# Patient Record
Sex: Female | Born: 1980 | Race: Black or African American | Hispanic: No | Marital: Married | Smoking: Never smoker
Health system: Southern US, Community
[De-identification: ages and names within clinical notes are randomized; demographics above are authoritative.]

## PROBLEM LIST (undated history)

## (undated) DIAGNOSIS — R519 Headache, unspecified: Secondary | ICD-10-CM

## (undated) DIAGNOSIS — F329 Major depressive disorder, single episode, unspecified: Secondary | ICD-10-CM

## (undated) DIAGNOSIS — M549 Dorsalgia, unspecified: Secondary | ICD-10-CM

## (undated) DIAGNOSIS — F32A Depression, unspecified: Secondary | ICD-10-CM

## (undated) DIAGNOSIS — R51 Headache: Secondary | ICD-10-CM

## (undated) HISTORY — DX: Major depressive disorder, single episode, unspecified: F32.9

## (undated) HISTORY — PX: OTHER SURGICAL HISTORY: SHX169

## (undated) HISTORY — DX: Depression, unspecified: F32.A

## (undated) HISTORY — DX: Headache, unspecified: R51.9

## (undated) HISTORY — DX: Headache: R51

## (undated) HISTORY — DX: Dorsalgia, unspecified: M54.9

---

## 2004-03-19 ENCOUNTER — Emergency Department (HOSPITAL_COMMUNITY): Admission: EM | Admit: 2004-03-19 | Discharge: 2004-03-19 | Payer: Self-pay | Admitting: Emergency Medicine

## 2004-12-21 ENCOUNTER — Emergency Department (HOSPITAL_COMMUNITY): Admission: EM | Admit: 2004-12-21 | Discharge: 2004-12-22 | Payer: Self-pay | Admitting: Emergency Medicine

## 2007-02-12 ENCOUNTER — Encounter (INDEPENDENT_AMBULATORY_CARE_PROVIDER_SITE_OTHER): Payer: Self-pay | Admitting: Obstetrics and Gynecology

## 2007-02-12 ENCOUNTER — Inpatient Hospital Stay (HOSPITAL_COMMUNITY): Admission: AD | Admit: 2007-02-12 | Discharge: 2007-02-14 | Payer: Self-pay | Admitting: Obstetrics and Gynecology

## 2010-04-01 ENCOUNTER — Inpatient Hospital Stay (HOSPITAL_COMMUNITY)
Admission: AD | Admit: 2010-04-01 | Discharge: 2010-04-03 | DRG: 775 | Disposition: A | Discharge status: Home / Self Care | Payer: 59 | Source: Ambulatory Visit | Attending: Obstetrics and Gynecology | Admitting: Obstetrics and Gynecology

## 2010-04-01 DIAGNOSIS — O409XX0 Polyhydramnios, unspecified trimester, not applicable or unspecified: Principal | ICD-10-CM | POA: Diagnosis present

## 2010-04-02 LAB — CBC
HCT: 31.3 % — ABNORMAL LOW (ref 36.0–46.0)
MCV: 89.2 fL (ref 78.0–100.0)
RBC: 3.51 MIL/uL — ABNORMAL LOW (ref 3.87–5.11)
WBC: 12.2 10*3/uL — ABNORMAL HIGH (ref 4.0–10.5)

## 2010-05-26 ENCOUNTER — Inpatient Hospital Stay (HOSPITAL_COMMUNITY): Admission: AD | Admit: 2010-05-26 | Payer: Self-pay | Source: Home / Self Care | Admitting: Obstetrics and Gynecology

## 2010-10-16 LAB — CBC
HCT: 28.9 — ABNORMAL LOW
Hemoglobin: 9.9 — ABNORMAL LOW
MCHC: 34.4
MCV: 91.3
RBC: 3.16 — ABNORMAL LOW

## 2012-09-04 ENCOUNTER — Other Ambulatory Visit: Payer: Self-pay | Admitting: Family

## 2012-09-04 ENCOUNTER — Ambulatory Visit
Admission: RE | Admit: 2012-09-04 | Discharge: 2012-09-04 | Disposition: A | Discharge status: Home / Self Care | Payer: 59 | Source: Ambulatory Visit | Attending: Family | Admitting: Family

## 2012-09-04 DIAGNOSIS — R200 Anesthesia of skin: Secondary | ICD-10-CM

## 2012-09-04 DIAGNOSIS — R531 Weakness: Secondary | ICD-10-CM

## 2012-09-29 ENCOUNTER — Ambulatory Visit (INDEPENDENT_AMBULATORY_CARE_PROVIDER_SITE_OTHER): Payer: 59 | Admitting: Neurology

## 2012-09-29 ENCOUNTER — Encounter: Payer: Self-pay | Admitting: Neurology

## 2012-09-29 VITALS — BP 130/80 | Ht 63.0 in | Wt 161.0 lb

## 2012-09-29 DIAGNOSIS — G43709 Chronic migraine without aura, not intractable, without status migrainosus: Secondary | ICD-10-CM | POA: Insufficient documentation

## 2012-09-29 DIAGNOSIS — G43909 Migraine, unspecified, not intractable, without status migrainosus: Secondary | ICD-10-CM

## 2012-09-29 MED ORDER — RIZATRIPTAN BENZOATE 10 MG PO TABS: 10.0000 mg | ORAL_TABLET | ORAL | Status: DC | PRN

## 2012-09-29 MED ORDER — PROPRANOLOL HCL 20 MG PO TABS: 20.0000 mg | ORAL_TABLET | Freq: Two times a day (BID) | ORAL | Status: DC

## 2012-09-29 MED ORDER — NORTRIPTYLINE HCL 10 MG PO CAPS: 20.0000 mg | ORAL_CAPSULE | Freq: Every day | ORAL | Status: DC

## 2012-09-29 NOTE — Progress Notes (Signed)
[facility]  PATIENT: Abigail Henry DOB: Aug 29, 1980  HISTORICAL  [name] is a 32 years old right-handed African American female, referred by her primary care physician Dr. Darcella Gasman for evaluation of chronic migraine headaches  Reported history of migraine headaches since 2012,  Her headache usually involves bilateral frontal temporal region, severe pounding headache with associated light noise sensitivity, dizziness, lasting all day, she has migraine 3 times a week, induced by stress, lack of sleep, postpartum period of time, she was put on contraceptives since January 2014, reported moderate improvement, but still has 3 headaches each week at least, she has tried Relpax, which helped her headache in 30 minutes, but she has to take a nap afterwards,  She also tried preventive medications Inderal, but she only taking 20 mg as needed basis, she states it helps, but never took it regularly like prescribed, she has tried Topamax titrating up to 200 mg a day without significant benefit,  She denied visual change,  REVIEW OF SYSTEMS: Full 14 system review of systems performed and notable only for joint pain, achy muscles, headache numbness dizziness  ALLERGIES: No Known Allergies  HOME MEDICATIONS: No outpatient prescriptions prior to visit.   No facility-administered medications prior to visit.    PAST MEDICAL HISTORY: Past Medical History  Diagnosis Date  . HA (headache)     PAST SURGICAL HISTORY: Past Surgical History  Procedure Laterality Date  . None      FAMILY HISTORY: Family History  Problem Relation Age of Onset  . High blood pressure Mother     SOCIAL HISTORY:  History   Social History  . Marital Status: Single    Spouse Name: [name]    Number of Children: 2  . Years of Education: 12   Occupational History  .  [employer]   Social History Main Topics  . Smoking status: Never Smoker   . Smokeless tobacco: Never Used  . Alcohol  Use: 0.6 oz/week    1 Cans of beer per week     Comment: OCC  . Drug Use: No  . Sexual Activity: Not on file   Other Topics Concern  . Not on file   Social History Narrative   Patient lives at home with her husband [name]). Patient works full time. [employer] .     Caffeine- two cups    Right handed.     PHYSICAL EXAM    Filed Vitals:   09/29/12 1446  Height: 5\' 3"  (1.6 m)  Weight: 161 lb (73.029 kg)    Not recorded    Body mass index is 28.53 kg/(m^2).   Generalized: In no acute distress  Neck: Supple, no carotid bruits   Cardiac: Regular rate rhythm  Pulmonary: Clear to auscultation bilaterally  Musculoskeletal: No deformity  Neurological examination  Mentation: Alert oriented to time, place, history taking, and causual conversation  Cranial nerve II-XII: Pupils were equal round reactive to light extraocular movements were full, visual field were full on confrontational test. facial sensation and strength were normal. hearing was intact to finger rubbing bilaterally. Uvula tongue midline.  head turning and shoulder shrug and were normal and symmetric.Tongue protrusion into cheek strength was normal.  Motor: normal tone, bulk and strength.  Sensory: Intact to fine touch, pinprick, preserved vibratory sensation, and proprioception at toes.  Coordination: Normal finger to nose, heel-to-shin bilaterally there was no truncal ataxia  Gait: Rising up from seated position without assistance, normal stance, without trunk ataxia, moderate  stride, good arm swing, smooth turning, able to perform tiptoe, and heel walking without difficulty.   Romberg signs: Negative  Deep tendon reflexes: Brachioradialis 2/2, biceps 2/2, triceps 2/2, patellar 2/2, Achilles 2/2, plantar responses were flexor bilaterally.   DIAGNOSTIC DATA (LABS, IMAGING, TESTING) - I reviewed patient records, labs, notes, testing and imaging myself where available.  Lab Results  Component Value Date    WBC 12.2* 04/02/2010   HGB 10.3* 04/02/2010   HCT 31.3* 04/02/2010   MCV 89.2 04/02/2010   PLT 282 04/02/2010    ASSESSMENT AND PLAN   32 years old right-handed African American female with chronic migraine headaches, normal neurological examination, she desires further evaluations,  1. proceed with MRI of the brain without contrast 2.  keep preventive medication Inderal 20 mg twice a day,, tapering off topiramate, which was not helping her, add on nortriptyline 10 mg titrating to 20 mg every night.  3. Maxalt as needed for abortive treatment.              Levert Feinstein, M.D. Ph.D.  [facility] [address], [address] [place], [place] [address] [phone]

## 2012-10-10 ENCOUNTER — Emergency Department (HOSPITAL_COMMUNITY)
Admission: EM | Admit: 2012-10-10 | Discharge: 2012-10-10 | Disposition: A | Discharge status: Home / Self Care | Payer: 59 | Attending: Emergency Medicine | Admitting: Emergency Medicine

## 2012-10-10 ENCOUNTER — Encounter (HOSPITAL_COMMUNITY): Payer: Self-pay | Admitting: Emergency Medicine

## 2012-10-10 DIAGNOSIS — G43909 Migraine, unspecified, not intractable, without status migrainosus: Secondary | ICD-10-CM | POA: Insufficient documentation

## 2012-10-10 DIAGNOSIS — R519 Headache, unspecified: Secondary | ICD-10-CM

## 2012-10-10 DIAGNOSIS — R42 Dizziness and giddiness: Secondary | ICD-10-CM | POA: Insufficient documentation

## 2012-10-10 DIAGNOSIS — Z79899 Other long term (current) drug therapy: Secondary | ICD-10-CM | POA: Insufficient documentation

## 2012-10-10 MED ORDER — KETOROLAC TROMETHAMINE 15 MG/ML IJ SOLN
15.0000 mg | Freq: Once | INTRAMUSCULAR | Status: AC
  Administered 2012-10-10: 15 mg via INTRAVENOUS
  Filled 2012-10-10: qty 1

## 2012-10-10 MED ORDER — SODIUM CHLORIDE 0.9 % IV BOLUS (SEPSIS)
500.0000 mL | Freq: Once | INTRAVENOUS | Status: AC
  Administered 2012-10-10: 500 mL via INTRAVENOUS

## 2012-10-10 MED ORDER — METOCLOPRAMIDE HCL 5 MG/ML IJ SOLN
10.0000 mg | Freq: Once | INTRAMUSCULAR | Status: AC
  Administered 2012-10-10: 10 mg via INTRAVENOUS
  Filled 2012-10-10: qty 2

## 2012-10-10 MED ORDER — DIPHENHYDRAMINE HCL 50 MG/ML IJ SOLN
25.0000 mg | Freq: Once | INTRAMUSCULAR | Status: AC
  Administered 2012-10-10: 25 mg via INTRAVENOUS
  Filled 2012-10-10: qty 1

## 2012-10-10 NOTE — ED Provider Notes (Signed)
CSN: [id number]     Arrival date & time 10/10/12  1478 History   First MD Initiated Contact with Patient 10/10/12 0820     Chief Complaint  Patient presents with  . Migraine   (Consider location/radiation/quality/duration/timing/severity/associated sxs/prior Treatment) HPI Comments: Patient with history of migraine headaches presents with acute onset of headache last evening that is similar to her usual headaches. Headache is frontal with radiation to the back of her head. She has positive photophobia, phonophobia, nausea but no vomiting. Unlike her typical headaches, she states that she feels somewhat dizzy but does not have any trouble walking. Dizziness is described as spinning. She denies recent head injury, fever, neck pain. No weakness in her arms or legs. She's been taking her chronic medications as prescribed. She recently began taking a new prophylactic medication. Course is constant. Nothing makes symptoms better.  Patient is a 32 y.o. female presenting with migraines. The history is provided by the patient.  Migraine Associated symptoms include headaches. Pertinent negatives include no chest pain, congestion, fever, nausea, neck pain, numbness, rash, vomiting or weakness.    Past Medical History  Diagnosis Date  . HA (headache)    Past Surgical History  Procedure Laterality Date  . None     Family History  Problem Relation Age of Onset  . High blood pressure Mother    History  Substance Use Topics  . Smoking status: Never Smoker   . Smokeless tobacco: Never Used  . Alcohol Use: 0.6 oz/week    1 Cans of beer per week     Comment: OCC   OB History   Grav Para Term Preterm Abortions TAB SAB Ect Mult Living                 Review of Systems  Constitutional: Negative for fever.  HENT: Negative for congestion, rhinorrhea, neck pain, neck stiffness, dental problem and sinus pressure.   Eyes: Positive for photophobia. Negative for discharge, redness and visual  disturbance.  Respiratory: Negative for shortness of breath.   Cardiovascular: Negative for chest pain.  Gastrointestinal: Negative for nausea and vomiting.  Musculoskeletal: Negative for gait problem.  Skin: Negative for rash.  Neurological: Positive for dizziness and headaches. Negative for syncope, speech difficulty, weakness, light-headedness and numbness.  Psychiatric/Behavioral: Negative for confusion.    Allergies  Review of patient's allergies indicates no known allergies.  Home Medications   Current Outpatient Rx  Name  Route  Sig  Dispense  Refill  . levonorgestrel-ethinyl estradiol (SEASONALE,INTROVALE,JOLESSA) 0.15-0.03 MG tablet   Oral   Take 1 tablet by mouth daily.          . nortriptyline (PAMELOR) 10 MG capsule   Oral   Take 20 mg by mouth at bedtime.         . promethazine (PHENERGAN) 12.5 MG tablet   Oral   Take 12.5 mg by mouth daily.         . propranolol (INDERAL) 20 MG tablet   Oral   Take 1 tablet (20 mg total) by mouth 2 (two) times daily.   60 tablet   12   . RELPAX 20 MG tablet   Oral   Take 20 mg by mouth daily as needed for migraine.          . rizatriptan (MAXALT) 10 MG tablet   Oral   Take 1 tablet (10 mg total) by mouth as needed for migraine. May repeat in 2 hours if needed   15 tablet  12   . topiramate (TOPAMAX) 200 MG tablet   Oral   Take 200 mg by mouth daily.           BP 121/75  Pulse 84  Temp(Src) 98.3 F (36.8 C) (Oral)  Resp 18  SpO2 100% Physical Exam  Nursing note and vitals reviewed. Constitutional: She is oriented to person, place, and time. She appears well-developed and well-nourished.  HENT:  Head: Normocephalic and atraumatic.  Right Ear: Tympanic membrane, external ear and ear canal normal.  Left Ear: Tympanic membrane, external ear and ear canal normal.  Nose: Nose normal.  Mouth/Throat: Uvula is midline, oropharynx is clear and moist and mucous membranes are normal.  Eyes: Conjunctivae,  EOM and lids are normal. Pupils are equal, round, and reactive to light. Right eye exhibits no nystagmus. Left eye exhibits no nystagmus.  Neck: Normal range of motion. Neck supple.  Cardiovascular: Normal rate and regular rhythm.   Pulmonary/Chest: Effort normal and breath sounds normal.  Abdominal: Soft. There is no tenderness.  Musculoskeletal:       Cervical back: She exhibits normal range of motion, no tenderness and no bony tenderness.  Neurological: She is alert and oriented to person, place, and time. She has normal strength and normal reflexes. No cranial nerve deficit or sensory deficit. She exhibits normal muscle tone. She displays a negative Romberg sign. Coordination and gait normal. GCS eye subscore is 4. GCS verbal subscore is 5. GCS motor subscore is 6.  Skin: Skin is warm and dry.  Psychiatric: She has a normal mood and affect.    ED Course  Procedures (including critical care time) Labs Review Labs Reviewed - No data to display Imaging Review No results found.  9:07 AM Patient seen and examined. Medications ordered. Normal neurological exam. No features of history or exam which would indicate need for imaging.   Vital signs reviewed and are as follows: Filed Vitals:   10/10/12 0823  BP: 121/75  Pulse: 84  Temp: 98.3 F (36.8 C)  Resp: 18   10:32 AM Pt states much improvement and she has milder residual HA. Will give toradol and discharge. Neurological exam is unchanged. She is to f/u with PCP/neurologist. Counseled patient to rest for the remainder of the day and hydrate well.   Patient urged to return with worsening symptoms or other concerns. Patient verbalized understanding and agrees with plan.    MDM   1. Headache    Pt with HA very similar to previous. Known h/o migraine followed by neurology. No head trauma. No thunderclap or other history suspicious for SAH/sentinel bleeding. No meningismus or fever suggestive of meningitis. Pt appears well.. Neuro  exam is stable/unchanged during ED stay.    Renne Crigler, PA-C 10/10/12 1038

## 2012-10-10 NOTE — ED Notes (Signed)
Pt states that she has been having a migraine since yesterday.  C/o pain on the front side of her head.  States that she is nauseated and dizzy.  Sensitivity to light and sound.

## 2012-10-10 NOTE — ED Provider Notes (Signed)
  Medical screening examination/treatment/procedure(s) were performed by non-physician practitioner and as supervising physician I was immediately available for consultation/collaboration.    Greenleigh Kauth, MD 10/10/12 1551 

## 2012-10-18 ENCOUNTER — Ambulatory Visit (INDEPENDENT_AMBULATORY_CARE_PROVIDER_SITE_OTHER): Payer: 59

## 2012-10-18 DIAGNOSIS — R51 Headache: Secondary | ICD-10-CM

## 2012-10-18 DIAGNOSIS — G43909 Migraine, unspecified, not intractable, without status migrainosus: Secondary | ICD-10-CM

## 2012-10-20 ENCOUNTER — Telehealth: Payer: Self-pay | Admitting: Neurology

## 2012-10-20 NOTE — Progress Notes (Signed)
Quick Note:  Call pt about her results of the MRI, pt verbalized understanding. ______

## 2012-10-20 NOTE — Telephone Encounter (Signed)
Call pt about results, pt verbalized understanding.

## 2012-10-20 NOTE — Telephone Encounter (Signed)
Message copied by Demetrios Loll on Fri Oct 20, 2012 11:27 AM ------      Message from: Levert Feinstein      Created: Thu Oct 19, 2012  2:51 PM       Please call pt for normal MRI brain. ------

## 2012-10-20 NOTE — Telephone Encounter (Signed)
Call pt no answer, left message. °

## 2012-11-30 ENCOUNTER — Other Ambulatory Visit: Payer: Self-pay

## 2013-01-01 ENCOUNTER — Ambulatory Visit: Payer: 59 | Admitting: Nurse Practitioner

## 2014-10-10 ENCOUNTER — Ambulatory Visit (INDEPENDENT_AMBULATORY_CARE_PROVIDER_SITE_OTHER): Payer: 59 | Admitting: Neurology

## 2014-10-10 ENCOUNTER — Encounter: Payer: Self-pay | Admitting: Neurology

## 2014-10-10 ENCOUNTER — Other Ambulatory Visit: Payer: Self-pay | Admitting: Neurology

## 2014-10-10 VITALS — BP 122/79 | HR 101 | Ht 63.0 in | Wt 167.0 lb

## 2014-10-10 DIAGNOSIS — M545 Low back pain: Secondary | ICD-10-CM

## 2014-10-10 DIAGNOSIS — G43009 Migraine without aura, not intractable, without status migrainosus: Secondary | ICD-10-CM

## 2014-10-10 DIAGNOSIS — M79604 Pain in right leg: Secondary | ICD-10-CM

## 2014-10-10 MED ORDER — TIZANIDINE HCL 4 MG PO TABS: 4.0000 mg | ORAL_TABLET | Freq: Four times a day (QID) | ORAL | Status: DC | PRN

## 2014-10-10 MED ORDER — NORTRIPTYLINE HCL 25 MG PO CAPS: 50.0000 mg | ORAL_CAPSULE | Freq: Every day | ORAL | Status: DC

## 2014-10-10 MED ORDER — ZOLMITRIPTAN 5 MG PO TBDP: 5.0000 mg | ORAL_TABLET | ORAL | Status: DC | PRN

## 2014-10-10 NOTE — Progress Notes (Signed)
PATIENT: Abigail Henry DOB: December 23, 1980  Chief Complaint  Patient presents with  . Back Pain    She has been having mid and low back pain.  She has some radiation to her right hip, buttock and leg down to her knee.  She has also experienced right arm numbness and tingling.  She recently completed a Prednisone dose pack without much benefit.  . Migraine    Her migraines are much worse.  She estimates 15+ headache days per month.  She is no longer taking a prophylactic medication.  Relpax is sometimes helpful.     HISTORICAL  Abigail Henry is a 34 years old right-handed female, seen in refer in October 10 2014 by  her primary care physician Dr. Marcellus Scott for evaluation of low back pain, migraine headaches  I have reviewed laboratory evaluation in March 2015, normal TSH, vitamin D level was low 9, normal being 30 and above, normal free T4, LDL was 33, cholesterol was 148, normal CBC, CMP, creatinine was 0.9  She had a history of migraine since 2012, usually starting from back of her head, spreading forward to become a pounding headaches, retro-orbital area, with associated light noise sensitivity, lasting for a few hours, she has up to 15 or more headaches days in a month  I have seen her in 2014 for similar complaints, she has tried Imitrex, cause sleepiness, not help her headaches, Maxalt, not effective, she has been taking Relpax as needed, only helped 20% of time, sometimes she take marijuana as needed basis for headaches,  Trigger for her headaches are weather change, stress, exertion, sleep deprivation, She has been taking Inderal 20 mg twice a day which has helped her headaches, previously tried nortriptyline was helpful, but she run out of refills, Topamax was no help, allergy pills Zyrtec was not helpful   Since 2012, she also noticed intermittent low back pain, getting worse since January 2016, right side low back pain, radiating to her right hip, right lateral leg, getting  worse with prolonged sitting, she denied persistent lower extremity sensory loss or weakness, she denies gait difficulty, she has no bowel bladder incontinence.  Since September 2016 she also has intermittent right hand paresthesia, radiating to right forearm, no weakness, no right facial involvement.  REVIEW OF SYSTEMS: Full 14 system review of systems performed and notable only for feeling hot, allergies, headaches, numbness, dizziness  ALLERGIES: No Known Allergies  HOME MEDICATIONS: Current Outpatient Prescriptions  Medication Sig Dispense Refill  . levonorgestrel-ethinyl estradiol (SEASONALE,INTROVALE,JOLESSA) 0.15-0.03 MG tablet Take 1 tablet by mouth daily.     . methocarbamol (ROBAXIN) 500 MG tablet TAKE 1 TABLET BY MOUTH TWICE A DAY AS NEEDED FOR MUSCLE STRAIN/SPASM  2  . promethazine (PHENERGAN) 12.5 MG tablet Take 12.5 mg by mouth daily.    . propranolol (INDERAL) 20 MG tablet Take 1 tablet (20 mg total) by mouth 2 (two) times daily. 60 tablet 12  . rizatriptan (MAXALT) 10 MG tablet Take 1 tablet (10 mg total) by mouth as needed for migraine. May repeat in 2 hours if needed 15 tablet 12   No current facility-administered medications for this visit.    PAST MEDICAL HISTORY: Past Medical History  Diagnosis Date  . HA (headache)   . Back pain     PAST SURGICAL HISTORY: Past Surgical History  Procedure Laterality Date  . None      FAMILY HISTORY: Family History  Problem Relation Age of Onset  . High blood pressure Mother   .  Healthy Father     SOCIAL HISTORY:  Social History   Social History  . Marital Status: Single    Spouse Name: [name]  . Number of Children: 2  . Years of Education: 12   Occupational History  .  [employer]   Social History Main Topics  . Smoking status: Never Smoker   . Smokeless tobacco: Never Used  . Alcohol Use: 0.6 oz/week    1 Cans of beer per week     Comment: 2 - 3 glasses on the weekend  . Drug Use: Yes     Comment:  Marijuana occasionally for migraines  . Sexual Activity: Not on file   Other Topics Concern  . Not on file   Social History Narrative   Patient lives at home with her husband [name]) and two children. Patient works full time. [employer] .     Caffeine- two cups    Right handed.     PHYSICAL EXAM   Filed Vitals:   10/10/14 1024  BP: 122/79  Pulse: 101  Height: 5\' 3"  (1.6 m)  Weight: 167 lb (75.751 kg)    Not recorded      Body mass index is 29.59 kg/(m^2).  PHYSICAL EXAMNIATION:  Gen: NAD, conversant, well nourised, obese, well groomed                     Cardiovascular: Regular rate rhythm, no peripheral edema, warm, nontender. Eyes: Conjunctivae clear without exudates or hemorrhage Neck: Supple, no carotid bruise. Pulmonary: Clear to auscultation bilaterally   NEUROLOGICAL EXAM:  MENTAL STATUS: Speech:    Speech is normal; fluent and spontaneous with normal comprehension.  Cognition:     Orientation to time, place and person     Normal recent and remote memory     Normal Attention span and concentration     Normal Language, naming, repeating,spontaneous speech     Fund of knowledge   CRANIAL NERVES: CN II: Visual fields are full to confrontation. Fundoscopic exam is normal with sharp discs and no vascular changes. Pupils are round equal and briskly reactive to light. CN III, IV, VI: extraocular movement are normal. No ptosis. CN V: Facial sensation is intact to pinprick in all 3 divisions bilaterally. Corneal responses are intact.  CN VII: Face is symmetric with normal eye closure and smile. CN VIII: Hearing is normal to rubbing fingers CN IX, X: Palate elevates symmetrically. Phonation is normal. CN XI: Head turning and shoulder shrug are intact CN XII: Tongue is midline with normal movements and no atrophy.  MOTOR: There is no pronator drift of out-stretched arms. Muscle bulk and tone are normal. Muscle strength is normal.  REFLEXES: Reflexes are 2+ and  symmetric at the biceps, triceps, knees, and ankles. Plantar responses are flexor.  SENSORY: Intact to light touch, pinprick, position sense, and vibration sense are intact in fingers and toes.  COORDINATION: Rapid alternating movements and fine finger movements are intact. There is no dysmetria on finger-to-nose and heel-knee-shin.    GAIT/STANCE: Posture is normal. Gait is steady with normal steps, base, arm swing, and turning. Heel and toe walking are normal. Tandem gait is normal.  Romberg is absent.   DIAGNOSTIC DATA (LABS, IMAGING, TESTING) - I reviewed patient records, labs, notes, testing and imaging myself where available.   ASSESSMENT AND PLAN  Adaleigh Warf is a 34 y.o. female   Chronic migraines without aura  Continue preventive medications Inderal 20 mg twice a day  Add on nortriptyline titrating to 25 mg 2 tablets every night as migraine prevention  She has tried and failed Imitrex, Maxalt, suboptimal response to Relpax   We will try Zomig as needed, may add on Phenergan, tizanidine as needed  Chronic right low back pain, radiating pain to right lower extremity  Suggestive of right lumbar radiculopathy  EMG nerve conduction study  Back stretching exercise Right hand paresthesia  Possible right carpal tunnel syndrome  EMG nerve conduction study  Levert Feinstein, M.D. Ph.D.  [facility] [address], [address] [place], [place] [address] Ph: [phone] Fax: [phone]  CC: Dr. Marcellus Scott

## 2014-10-31 ENCOUNTER — Ambulatory Visit (INDEPENDENT_AMBULATORY_CARE_PROVIDER_SITE_OTHER): Payer: Self-pay | Admitting: Neurology

## 2014-10-31 ENCOUNTER — Ambulatory Visit (INDEPENDENT_AMBULATORY_CARE_PROVIDER_SITE_OTHER): Payer: 59 | Admitting: Neurology

## 2014-10-31 DIAGNOSIS — M79604 Pain in right leg: Secondary | ICD-10-CM

## 2014-10-31 DIAGNOSIS — R202 Paresthesia of skin: Secondary | ICD-10-CM | POA: Diagnosis not present

## 2014-10-31 DIAGNOSIS — Z0289 Encounter for other administrative examinations: Secondary | ICD-10-CM

## 2014-10-31 DIAGNOSIS — M545 Low back pain: Secondary | ICD-10-CM | POA: Diagnosis not present

## 2014-10-31 DIAGNOSIS — G43009 Migraine without aura, not intractable, without status migrainosus: Secondary | ICD-10-CM

## 2014-10-31 NOTE — Progress Notes (Signed)
Electrodiagnostic study is normal, there is no electrodiagnostic evidence of large fiber peripheral neuropathy, right cervical radiculopathy, or right lumbosacral radiculopathy.

## 2014-10-31 NOTE — Procedures (Signed)
   NCS (NERVE CONDUCTION STUDY) WITH EMG (ELECTROMYOGRAPHY) REPORT   STUDY DATE: October 31 2014 PATIENT NAME: Abigail Henry DOB: Nov 28, 1980 MRN: [id number]    TECHNOLOGIST: Gearldine Shown ELECTROMYOGRAPHER: Levert Feinstein M.D.  CLINICAL INFORMATION:  34 year old right-handed female, complains of intermittent right leg discomfort pain, right hand paresthesia.  FINDINGS: NERVE CONDUCTION STUDY: Bilateral peroneal sensory responses were normal. Bilateral peroneal to EDB, tibial motor responses were normal. Bilateral tibial H reflexes were normal and symmetric.  Right median, ulnar sensory and motor responses were normal.  NEEDLE ELECTROMYOGRAPHY: Selective needle examinations were performed at right upper and lower lower extremity muscles, right cervical and lumbosacral paraspinal muscles.  Needle examination of right tibialis anterior, tibialis posterior, medial gastrocnemius, vastus lateralis, biceps femoris short head was normal.  Selected needle examination of right extensor digitorum communis, pronator teres, biceps, triceps, deltoid was normal.  There was no spontaneous activity at right cervical paraspinal muscles, right C5, C6, 7; right L4, 5, S1.  IMPRESSION:  This is a normal study. There is NO electrodiagnostic evidence of large fiber peripheral neuropathy, right cervical radiculopathy, or right lumbosacral radiculopathy.    INTERPRETING PHYSICIAN:   Levert Feinstein M.D. Ph.D. [facility] [address] [place], [place] [address] [phone]

## 2014-11-14 ENCOUNTER — Telehealth: Payer: Self-pay | Admitting: Neurology

## 2014-11-14 NOTE — Telephone Encounter (Signed)
Left patient a message letting her know that she can drop off her FMLA paperwork to the front desk.  I told her we try to get to the paperwork completed as soon as possible.  Our office tells patients to allow 10 business days but I told her to let us know if she has a specific deadline.

## 2014-11-14 NOTE — Telephone Encounter (Signed)
Patient is calling. She needs to drop off FMLA paperwork and is wondering if she drops it off can it be filled out. Please call and discuss. Thank you.

## 2014-11-25 ENCOUNTER — Telehealth: Payer: Self-pay | Admitting: *Deleted

## 2014-11-25 NOTE — Telephone Encounter (Signed)
Patient form on Michelle desk. 

## 2014-11-27 NOTE — Telephone Encounter (Addendum)
Spoke to patient. Explained why unable to honor FMLA paperwork. Patient verbalized understanding.

## 2014-11-27 NOTE — Telephone Encounter (Signed)
Called patient to inform her [employer] will not be able to honor her FMLA paperwork. No answer. Left vmail to return call.

## 2014-12-02 ENCOUNTER — Encounter: Payer: Self-pay | Admitting: Neurology

## 2014-12-02 ENCOUNTER — Ambulatory Visit (INDEPENDENT_AMBULATORY_CARE_PROVIDER_SITE_OTHER): Payer: 59 | Admitting: Neurology

## 2014-12-02 VITALS — BP 118/81 | HR 87 | Ht 63.0 in | Wt 166.0 lb

## 2014-12-02 DIAGNOSIS — M545 Low back pain, unspecified: Secondary | ICD-10-CM

## 2014-12-02 DIAGNOSIS — G43009 Migraine without aura, not intractable, without status migrainosus: Secondary | ICD-10-CM | POA: Diagnosis not present

## 2014-12-02 MED ORDER — PROPRANOLOL HCL ER 80 MG PO CP24: 80.0000 mg | ORAL_CAPSULE | Freq: Every day | ORAL | Status: DC

## 2014-12-02 MED ORDER — NORTRIPTYLINE HCL 25 MG PO CAPS: 75.0000 mg | ORAL_CAPSULE | Freq: Every day | ORAL | Status: DC

## 2014-12-02 NOTE — Progress Notes (Signed)
Chief Complaint  Patient presents with  . Migraine    She has titrated her nortriptyline dose to  at bedtime and is taking Inderal  BID.  She is still estimating 3-5 headache days per week.  She has gotten some relief with Zomig for her more severe pain.  . Back Pain    She would like to discuss her EMG/NCV results.  . Hand Paresthesia    She would like to discuss her EMG/NCV results.      PATIENT: Abigail Henry DOB: October 04, 1980  Chief Complaint  Patient presents with  . Migraine    She has titrated her nortriptyline dose to  at bedtime and is taking Inderal  BID.  She is still estimating 3-5 headache days per week.  She has gotten some relief with Zomig for her more severe pain.  . Back Pain    She would like to discuss her EMG/NCV results.  . Hand Paresthesia    She would like to discuss her EMG/NCV results.     HISTORICAL  Abigail Henry is a 34 years old right-handed female, seen in refer in October 10 2014 by  her primary care physician Dr. Marcellus Scott for evaluation of low back pain, migraine headaches  I have reviewed laboratory evaluation in March 2015, normal TSH, vitamin D level was low 9, normal being 30 and above, normal free T4, LDL was 33, cholesterol was 148, normal CBC, CMP, creatinine was 0.9  She had a history of migraine since 2012, usually starting from back of her head, spreading forward to become a pounding headaches, retro-orbital area, with associated light noise sensitivity, lasting for a few hours, she has up to 15 or more headaches days in a month  I have seen her in 2014 for similar complaints, she has tried Imitrex, cause sleepiness, not help her headaches, Maxalt, not effective, she has been taking Relpax as needed, only helped 20% of time, sometimes she take marijuana as needed basis for headaches,  Trigger for her headaches are weather change, stress, exertion, sleep deprivation, She has been taking Inderal 20 mg twice a day which  has helped her headaches, previously tried nortriptyline was helpful, but she run out of refills, Topamax was no help, allergy pills Zyrtec was not helpful   Since 2012, she also noticed intermittent low back pain, getting worse since January 2016, right side low back pain, radiating to her right hip, right lateral leg, getting worse with prolonged sitting, she denied persistent lower extremity sensory loss or weakness, she denies gait difficulty, she has no bowel bladder incontinence.  Since September 2016 she also has intermittent right hand paresthesia, radiating to right forearm, no weakness, no right facial involvement.  UPDATE Dec 02 2014: EMG nerve conduction study was normal in October 31 2014, she complains of upper back pain, denies radiating pain, no gait difficulty, EMG nerve conduction study October 31 2014 was normal. There was no evidence of peripheral neuropathy, cervical radiculopathy, or right lumbosacral radiculopathy. She still has headaches 2-5 times each week, moderate severe, with associated light noise sensitivity, she think Inderal 20 mg twice a day, nortriptyline 25 mg 2 tablets every night was helpful as preventive medications, Zomig when necessary was effective as abortive treatment.  REVIEW OF SYSTEMS: Full 14 system review of systems performed and notable only for dizziness headaches, light sensitivity ALLERGIES: No Known Allergies  HOME MEDICATIONS: Current Outpatient Prescriptions  Medication Sig Dispense Refill  . CAMRESE 0.15-0.03 &0.01 MG tablet Take 1 tablet by  mouth daily.  2  . methocarbamol (ROBAXIN) 500 MG tablet TAKE 1 TABLET BY MOUTH TWICE A DAY AS NEEDED FOR MUSCLE STRAIN/SPASM  2  . nortriptyline (PAMELOR) 25 MG capsule Take 2 capsules (50 mg total) by mouth at bedtime. 60 capsule 6  . promethazine (PHENERGAN) 12.5 MG tablet Take 12.5 mg by mouth daily.    . propranolol (INDERAL) 20 MG tablet Take 1 tablet (20 mg total) by mouth 2 (two) times daily. 60  tablet 12  . tiZANidine (ZANAFLEX) 4 MG tablet Take 1 tablet (4 mg total) by mouth every 6 (six) hours as needed for muscle spasms. 30 tablet 6  . zolmitriptan (ZOMIG-ZMT) 5 MG disintegrating tablet Take 1 tablet (5 mg total) by mouth as needed for migraine. 10 tablet 6   No current facility-administered medications for this visit.    PAST MEDICAL HISTORY: Past Medical History  Diagnosis Date  . HA (headache)   . Back pain     PAST SURGICAL HISTORY: Past Surgical History  Procedure Laterality Date  . None      FAMILY HISTORY: Family History  Problem Relation Age of Onset  . High blood pressure Mother   . Healthy Father     SOCIAL HISTORY:  Social History   Social History  . Marital Status: Single    Spouse Name: Jeannett SeniorStephen  . Number of Children: 2  . Years of Education: 12   Occupational History  .  [employer]   Social History Main Topics  . Smoking status: Never Smoker   . Smokeless tobacco: Never Used  . Alcohol Use: 0.6 oz/week    1 Cans of beer per week     Comment: 2 - 3 glasses on the weekend  . Drug Use: Yes     Comment: Marijuana occasionally for migraines  . Sexual Activity: Not on file   Other Topics Concern  . Not on file   Social History Narrative   Patient lives at home with her husband [name](Stephen) and two children. Patient works full time. [employer] .     Caffeine- two cups    Right handed.     PHYSICAL EXAM   Filed Vitals:   12/02/14 1131  BP: 118/81  Pulse: 87  Height: 5\' 3"  (1.6 m)  Weight: 166 lb (75.297 kg)    Not recorded      Body mass index is 29.41 kg/(m^2).  PHYSICAL EXAMNIATION:  Gen: NAD, conversant, well nourised, obese, well groomed                     Cardiovascular: Regular rate rhythm, no peripheral edema, warm, nontender. Eyes: Conjunctivae clear without exudates or hemorrhage Neck: Supple, no carotid bruise. Pulmonary: Clear to auscultation bilaterally   NEUROLOGICAL EXAM:  MENTAL STATUS: Speech:    Speech is  normal; fluent and spontaneous with normal comprehension.  Cognition:     Orientation to time, place and person     Normal recent and remote memory     Normal Attention span and concentration     Normal Language, naming, repeating,spontaneous speech     Fund of knowledge   CRANIAL NERVES: CN II: Visual fields are full to confrontation. Fundoscopic exam is normal with sharp discs and no vascular changes. Pupils are round equal and briskly reactive to light. CN III, IV, VI: extraocular movement are normal. No ptosis. CN V: Facial sensation is intact to pinprick in all 3 divisions bilaterally. Corneal responses are intact.  CN VII: Face is symmetric with normal eye closure and smile. CN VIII: Hearing is normal to rubbing fingers CN IX, X: Palate elevates symmetrically. Phonation is normal. CN XI: Head turning and shoulder shrug are intact CN XII: Tongue is midline with normal movements and no atrophy.  MOTOR: There is no pronator drift of out-stretched arms. Muscle bulk and tone are normal. Muscle strength is normal.  REFLEXES: Reflexes are 2+ and symmetric at the biceps, triceps, knees, and ankles. Plantar responses are flexor.  SENSORY: Intact to light touch, pinprick, position sense, and vibration sense are intact in fingers and toes.  COORDINATION: Rapid alternating movements and fine finger movements are intact. There is no dysmetria on finger-to-nose and heel-knee-shin.    GAIT/STANCE: Posture is normal. Gait is steady with normal steps, base, arm swing, and turning. Heel and toe walking are normal. Tandem gait is normal.  Romberg is absent.   DIAGNOSTIC DATA (LABS, IMAGING, TESTING) - I reviewed patient records, labs, notes, testing and imaging myself where available.   ASSESSMENT AND PLAN  Abigail Henry is a 34 y.o. female   Chronic migraines without aura  Continue preventive medications   Increase the dose of Inderal LA to 80 mg daily  Increased nortriptyline  titrating to 25 mg 3 tablets every night as migraine prevention  She has tried and failed Imitrex, Maxalt, suboptimal response to Relpax   Zomig as needed works well for her, may add on Phenergan, tizanidine as needed  Chronic right low back pain, radiating pain to right lower extremity  Musculoskeletal etiology  Back stretching exercise  Cord compression    Levert Feinstein, M.D. Ph.D.  [facility] [address] [place], [place] [address] Ph: [phone] Fax: [phone]  CC: Dr. Marcellus Scott

## 2014-12-06 ENCOUNTER — Other Ambulatory Visit: Payer: Self-pay

## 2014-12-06 MED ORDER — PROPRANOLOL HCL ER 80 MG PO CP24: 80.0000 mg | ORAL_CAPSULE | Freq: Every day | ORAL | Status: DC

## 2015-02-01 ENCOUNTER — Other Ambulatory Visit: Payer: Self-pay

## 2015-02-01 MED ORDER — PROPRANOLOL HCL ER 80 MG PO CP24: 80.0000 mg | ORAL_CAPSULE | Freq: Every day | ORAL | Status: DC

## 2015-03-04 ENCOUNTER — Ambulatory Visit (INDEPENDENT_AMBULATORY_CARE_PROVIDER_SITE_OTHER): Payer: 59 | Admitting: Neurology

## 2015-03-04 ENCOUNTER — Encounter: Payer: Self-pay | Admitting: Neurology

## 2015-03-04 VITALS — BP 123/71 | HR 98 | Ht 63.0 in | Wt 167.0 lb

## 2015-03-04 DIAGNOSIS — G43709 Chronic migraine without aura, not intractable, without status migrainosus: Secondary | ICD-10-CM | POA: Diagnosis not present

## 2015-03-04 DIAGNOSIS — M545 Low back pain, unspecified: Secondary | ICD-10-CM

## 2015-03-04 MED ORDER — ZONISAMIDE 100 MG PO CAPS: ORAL_CAPSULE | ORAL | Status: DC

## 2015-03-04 NOTE — Patient Instructions (Signed)
Magnesium oxide 400 mg twice a day Riboflavin  100 mg twice a day 

## 2015-03-04 NOTE — Progress Notes (Signed)
Chief Complaint  Patient presents with  . Migraine    She is here with her husband, [name].  Feels her migraines have only mildly improved since her Inderal LA and Pamelor have been increased.  Currenlty, she has been experiencing 3-4 headache days per week but feels this may be related to her allergy problems.  On occasion, she will smoke marijuana in attempt to relieve her headaches.      PATIENT: Abigail Henry DOB: January 20, 1981  Chief Complaint  Patient presents with  . Migraine    She is here with her husband, [name].  Feels her migraines have only mildly improved since her Inderal LA and Pamelor have been increased.  Currenlty, she has been experiencing 3-4 headache days per week but feels this may be related to her allergy problems.  On occasion, she will smoke marijuana in attempt to relieve her headaches.     HISTORICAL  Abigail Henry is a 35 years old right-handed female, seen in refer in October 10 2014 by  her primary care physician Dr. Marcellus Scott for evaluation of low back pain, migraine headaches  I have reviewed laboratory evaluation in March 2015, normal TSH, vitamin D level was low 9, normal being 30 and above, normal free T4, LDL was 33, cholesterol was 148, normal CBC, CMP, creatinine was 0.9  She had a history of migraine since 2012, usually starting from back of her head, spreading forward to become a pounding headaches, retro-orbital area, with associated light noise sensitivity, lasting for a few hours, she has up to 15 or more headaches days in a month  I have seen her in 2014 for similar complaints, she has tried Imitrex, cause sleepiness, not help her headaches, Maxalt, not effective, she has been taking Relpax as needed, only helped 20% of time, sometimes she take marijuana as needed basis for headaches,  Trigger for her headaches are weather change, stress, exertion, sleep deprivation, She has been taking Inderal 20 mg twice a day which has helped her  headaches, previously tried nortriptyline was helpful, but she run out of refills, Topamax was no help, allergy pills Zyrtec was not helpful   Since 2012, she also noticed intermittent low back pain, getting worse since January 2016, right side low back pain, radiating to her right hip, right lateral leg, getting worse with prolonged sitting, she denied persistent lower extremity sensory loss or weakness, she denies gait difficulty, she has no bowel bladder incontinence.  Since September 2016 she also has intermittent right hand paresthesia, radiating to right forearm, no weakness, no right facial involvement.  UPDATE Dec 02 2014: she complains of upper back pain, denies radiating pain, no gait difficulty, EMG nerve conduction study October 31 2014 was normal. There was no evidence of peripheral neuropathy, cervical radiculopathy, or right lumbosacral radiculopathy. She still has headaches 2-5 times each week, moderate severe, with associated light noise sensitivity, she think Inderal 20 mg twice a day, nortriptyline 25 mg 2 tablets every night was helpful as preventive medications, Zomig when necessary was effective as abortive treatment.  UPDATE Feb 7th 2017: She has 3-4 headaches each week, pressure, throbbing, with light noise sensitivity, lasting for whole day. Zomig was helpful, but she often has rebound headache few hours later, Since Jan 2017, she also complains of intermittent episodes of bilateral lower extremity pain and feet numbness she denies difficulty walking, no incontinence, she has a lot of nasal congestions.  REVIEW OF SYSTEMS: Full 14 system review of systems performed and notable only  for dizziness headaches, fatigue, runny nose, cough, back pain, achy muscles, walking difficulty ALLERGIES: No Known Allergies  HOME MEDICATIONS: Current Outpatient Prescriptions  Medication Sig Dispense Refill  . ALTAVERA 0.15-30 MG-MCG tablet TAKE 1 TABLET BY ORAL ROUTE EVERY DAY  0  . CAMRESE  0.15-0.03 &0.01 MG tablet Take 1 tablet by mouth daily.  2  . methocarbamol (ROBAXIN) 500 MG tablet TAKE 1 TABLET BY MOUTH TWICE A DAY AS NEEDED FOR MUSCLE STRAIN/SPASM  2  . nortriptyline (PAMELOR) 25 MG capsule Take 3 capsules (75 mg total) by mouth at bedtime. 90 capsule 6  . promethazine (PHENERGAN) 12.5 MG tablet Take 12.5 mg by mouth daily.    . propranolol ER (INDERAL LA) 80 MG 24 hr capsule Take 1 capsule (80 mg total) by mouth daily. 90 capsule 0  . tiZANidine (ZANAFLEX) 4 MG tablet Take 1 tablet (4 mg total) by mouth every 6 (six) hours as needed for muscle spasms. 30 tablet 6  . zolmitriptan (ZOMIG-ZMT) 5 MG disintegrating tablet Take 1 tablet (5 mg total) by mouth as needed for migraine. 10 tablet 6   No current facility-administered medications for this visit.    PAST MEDICAL HISTORY: Past Medical History  Diagnosis Date  . HA (headache)   . Back pain     PAST SURGICAL HISTORY: Past Surgical History  Procedure Laterality Date  . None      FAMILY HISTORY: Family History  Problem Relation Age of Onset  . High blood pressure Mother   . Healthy Father     SOCIAL HISTORY:  Social History   Social History  . Marital Status: Single    Spouse Name: [name]  . Number of Children: 2  . Years of Education: 12   Occupational History  .  [employer]   Social History Main Topics  . Smoking status: Never Smoker   . Smokeless tobacco: Never Used  . Alcohol Use: 0.6 oz/week    1 Cans of beer per week     Comment: 2 - 3 glasses on the weekend  . Drug Use: Yes     Comment: Marijuana occasionally for migraines  . Sexual Activity: Not on file   Other Topics Concern  . Not on file   Social History Narrative   Patient lives at home with her husband [name]) and two children. Patient works full time. [employer] .     Caffeine- two cups    Right handed.     PHYSICAL EXAM   Filed Vitals:   03/04/15 1103  BP: 123/71  Pulse: 98  Height: 5\' 3"  (1.6 m)  Weight: 167 lb  (75.751 kg)    Not recorded      Body mass index is 29.59 kg/(m^2).  PHYSICAL EXAMNIATION:  Gen: NAD, conversant, well nourised, obese, well groomed                     Cardiovascular: Regular rate rhythm, no peripheral edema, warm, nontender. Eyes: Conjunctivae clear without exudates or hemorrhage Neck: Supple, no carotid bruise. Pulmonary: Clear to auscultation bilaterally   NEUROLOGICAL EXAM:  MENTAL STATUS: Speech:    Speech is normal; fluent and spontaneous with normal comprehension.  Cognition:     Orientation to time, place and person     Normal recent and remote memory     Normal Attention span and concentration     Normal Language, naming, repeating,spontaneous speech     Fund of knowledge   CRANIAL NERVES:  CN II: Visual fields are full to confrontation. Fundoscopic exam is normal with sharp discs and no vascular changes. Pupils are round equal and briskly reactive to light. CN III, IV, VI: extraocular movement are normal. No ptosis. CN V: Facial sensation is intact to pinprick in all 3 divisions bilaterally. Corneal responses are intact.  CN VII: Face is symmetric with normal eye closure and smile. CN VIII: Hearing is normal to rubbing fingers CN IX, X: Palate elevates symmetrically. Phonation is normal. CN XI: Head turning and shoulder shrug are intact CN XII: Tongue is midline with normal movements and no atrophy.  MOTOR: There is no pronator drift of out-stretched arms. Muscle bulk and tone are normal. Muscle strength is normal.  REFLEXES: Reflexes are 2+ and symmetric at the biceps, triceps, knees, and ankles. Plantar responses are flexor.  SENSORY: Intact to light touch, pinprick, position sense, and vibration sense are intact in fingers and toes.  COORDINATION: Rapid alternating movements and fine finger movements are intact. There is no dysmetria on finger-to-nose and heel-knee-shin.    GAIT/STANCE: Posture is normal. Gait is steady with normal  steps, base, arm swing, and turning. Heel and toe walking are normal. Tandem gait is normal.  Romberg is absent.   DIAGNOSTIC DATA (LABS, IMAGING, TESTING) - I reviewed patient records, labs, notes, testing and imaging myself where available.   ASSESSMENT AND PLAN  Abigail Henry is a 35 y.o. female   Chronic migraines without aura    She tried Topamax in the past without benefit, currently taking Inderal, nortriptyline, continue have frequent headaches,  Keep currentdose of Inderal LA to 80 mg daily  Keepnortriptyline titrating to 25 mg 3 tablets every night as migraine prevention  I also suggested magnesium oxide 400 mg, riboflavin 100 mg twice a day as preventive medications   preauthorization for Botox, return to clinic for injection it is approved.  She has tried and failed Imitrex, Maxalt, suboptimal response to Relpax   Zomig as needed works well for her, may add on Phenergan, tizanidine as needed  Chronic right low back pain, radiating pain to right lower extremity  Musculoskeletal etiology  Back stretching exercise  Cord compression    Levert Feinstein, M.D. Ph.D.  [facility] [address] [place], [place] [address] Ph: [phone] Fax: [phone]  CC: Dr. Marcellus Scott

## 2015-04-30 ENCOUNTER — Telehealth: Payer: Self-pay | Admitting: *Deleted

## 2015-04-30 DIAGNOSIS — Z0289 Encounter for other administrative examinations: Secondary | ICD-10-CM

## 2015-04-30 NOTE — Telephone Encounter (Signed)
Patient FMLA form on Michelle desk. 

## 2015-05-02 ENCOUNTER — Telehealth: Payer: Self-pay | Admitting: Neurology

## 2015-05-02 NOTE — Telephone Encounter (Signed)
FMLA forms faxed to [employer] [phone].

## 2015-05-06 ENCOUNTER — Telehealth: Payer: Self-pay | Admitting: Neurology

## 2015-05-06 NOTE — Telephone Encounter (Signed)
Called patient and left a VM regarding scheduling her Botox injection.

## 2015-05-13 NOTE — Telephone Encounter (Signed)
Pt called to request her FMLA paper work be updated to [employer] 40 hrs in month not 5 days in a month. Her place of work will take a whole day even if she uses a couple hrs. Please call and advise [phone]

## 2015-05-13 NOTE — Telephone Encounter (Signed)
Noted  

## 2015-05-13 NOTE — Telephone Encounter (Signed)
Pt would like to wait till her current bill is caught up. She will call to schedule.

## 2015-05-21 ENCOUNTER — Telehealth: Payer: Self-pay | Admitting: Neurology

## 2015-05-21 NOTE — Telephone Encounter (Signed)
She is having a significant migraine and wanted to make sure she could take her methocarbamol and zolmitriptan.  She will try both medications this evening.  I told her to call back if her headache does not resolve with home medications.

## 2015-05-21 NOTE — Telephone Encounter (Signed)
Patient called, states she is having "real bad pain in head, back and lower body muscles, can she take both methocarbamol (ROBAXIN) 500 MG tablet and zolmitriptan (ZOMIG-ZMT) 5 MG disintegrating tablet.

## 2015-08-21 ENCOUNTER — Telehealth: Payer: Self-pay | Admitting: Neurology

## 2015-08-21 NOTE — Telephone Encounter (Signed)
Spoke with patient and informed her Dr Marjory Lies recommends she see NP. Patient agreed; scheduled her for follow up with Enid Skeens NP 08/25/15 for c/o headache and possible medication adjustments.  Patient asked about FMLA papers and extension of hours in the event she left work today. This RN advised her that she is not familiar with her FMLA papers and conditions, but she could bring FMLA papers in to the office. Informed her that Marcelino Duster, Dr Zannie Cove RN returns to the office next Tuesday.  Patient stated she "would figure it out". She verbalized understanding, appreciation.

## 2015-08-21 NOTE — Telephone Encounter (Signed)
Have patient come in and see NP Aundra Millet or Bertrand). -VRP

## 2015-08-21 NOTE — Telephone Encounter (Signed)
Patient is calling. She has had a headache x2 days and cannot take her headache medication Zomig-ZMT because it makes her sleepy and she cannot work. She wants to know what else she can take and if she needs an appointment. Please call and discuss.

## 2015-08-21 NOTE — Telephone Encounter (Addendum)
Spoke with patient to discuss medications. Informed her Dr Terrace Arabia and Marcelino Duster RN are out of the office today. She is taking Inderal, nortriptyline for headache prevention but not taking magnesium oxide and riboflavin as Dr Terrace Arabia advised. She stated she has a migraine but has gone to work today. She is slightly nauseated but does not have any more phenergan which came from her pcp. She stated Ibuprofen, Tylenol only work for mild headaches, and Zomig makes her sleepy.  She does not have Tizanidine prescribed at this time. Confirmed her pharmacy as [facility], [place].  Informed her would route to work in dr and call her back. She verbalized understanding, appreciation for call back.

## 2015-08-25 ENCOUNTER — Ambulatory Visit: Payer: Self-pay | Admitting: Nurse Practitioner

## 2015-08-27 ENCOUNTER — Ambulatory Visit (INDEPENDENT_AMBULATORY_CARE_PROVIDER_SITE_OTHER): Payer: 59 | Admitting: Nurse Practitioner

## 2015-08-27 ENCOUNTER — Encounter: Payer: Self-pay | Admitting: Nurse Practitioner

## 2015-08-27 VITALS — BP 116/72 | HR 68 | Ht 63.0 in | Wt 161.0 lb

## 2015-08-27 DIAGNOSIS — M545 Low back pain, unspecified: Secondary | ICD-10-CM

## 2015-08-27 DIAGNOSIS — G43709 Chronic migraine without aura, not intractable, without status migrainosus: Secondary | ICD-10-CM | POA: Diagnosis not present

## 2015-08-27 MED ORDER — TIZANIDINE HCL 4 MG PO TABS: 4.0000 mg | ORAL_TABLET | Freq: Four times a day (QID) | ORAL | 6 refills | Status: DC | PRN

## 2015-08-27 MED ORDER — ZOLMITRIPTAN 5 MG PO TBDP: 5.0000 mg | ORAL_TABLET | ORAL | 6 refills | Status: DC | PRN

## 2015-08-27 MED ORDER — PROPRANOLOL HCL ER 80 MG PO CP24: 80.0000 mg | ORAL_CAPSULE | Freq: Every day | ORAL | 1 refills | Status: DC

## 2015-08-27 MED ORDER — NORTRIPTYLINE HCL 25 MG PO CAPS: 75.0000 mg | ORAL_CAPSULE | Freq: Every day | ORAL | 6 refills | Status: DC

## 2015-08-27 NOTE — Patient Instructions (Signed)
Given a list of migraine triggers  Continue Pamelor and Inderal LA as preventatives  Can take I/2 of Zomig  Acutely  Foll follow-up in  41months

## 2015-08-27 NOTE — Progress Notes (Signed)
[facility]  PATIENT: Abigail Henry DOB: 1981/01/13   REASON FOR VISIT: follow-up for migraine  HISTORY FROM: patient    HISTORY OF PRESENT ILLNESS:[name] is a 35 years old right-handed female, seen in refer in October 10 2014 by  her primary care physician Dr. Marcellus Scott for evaluation of low back pain, migraine headaches Patient currently describes his lower extremities in July 2 weeks and will lose her knee doing good arm were doing well  B after she gets speech is (we have the [facility] scheduled physiatry 50 every 8 8 C5 I saw there haven't had a chance really look at it is I have reviewed laboratory evaluation in March 2015, normal TSH, vitamin D level was low 9, normal being 30 and above, normal free T4, LDL was 33, cholesterol was 148, normal CBC, CMP, creatinine was 0.9 She had a history of migraine since 2012, usually starting from back of her head, spreading forward to become a pounding headaches, retro-orbital area, with associated light noise sensitivity, lasting for a few hours, she has up to 15 or more headaches days in a month  I have seen her in 2014 for similar complaints, she has tried Imitrex, cause sleepiness, not help her headaches, Maxalt, not effective, she has been taking Relpax as needed, only helped 20% of time, sometimes she take marijuana as needed basis for headaches, Trigger for her headaches are weather change, stress, exertion, sleep deprivation, She has been taking Inderal 20 mg twice a day which has helped her headaches, previously tried nortriptyline was helpful, but she run out of refills, Topamax was no help, allergy pills Zyrtec was not helpful  Since 2012, she also noticed intermittent low back pain, getting worse since January 2016, right side low back pain, radiating to her right hip, right lateral leg, getting worse with prolonged sitting, she denied persistent lower extremity sensory loss or weakness, she  denies gait difficulty, she has no bowel bladder incontinence. Since September 2016 she also has intermittent right hand paresthesia, radiating to right forearm, no weakness, no right facial involvement.  UPDATE Dec 02 2014:YYshe complains of upper back pain, denies radiating pain, no gait difficulty, EMG nerve conduction study October 31 2014 was normal. There was no evidence of peripheral neuropathy, cervical radiculopathy, or right lumbosacral radiculopathy. She still has headaches 2-5 times each week, moderate severe, with associated light noise sensitivity, she think Inderal 20 mg twice a day, nortriptyline 25 mg 2 tablets every night was helpful as preventive medications, Zomig when necessary was effective as abortive treatment.  UPDATE Feb 7th 2017:YYShe has 3-4 headaches each week, pressure, throbbing, with light noise sensitivity, lasting for whole day. Zomig was helpful, but she often has rebound headache few hours later, Since Jan 2017, she also complains of intermittent episodes of bilateral lower extremity pain and feet numbness she denies difficulty walking, no incontinence, she has a lot of nasal congestions UPDATE 08/02/2017CM [name], 35 year old female returns for follow-up. She is having about 1-2 headaches per week however when she takes her Zomig makes her very drowsy especially when she has to go to work. It does eliminate the headache. She is on nortriptyline 75 mg at bedtime and Inderal LA 80 mg daily. She has tizanidine to take when necessary muscle spasms. She is not aware of any specific migraine triggers. 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 She returns for reevaluation. She is previously been seen by Dr. Terrace Arabia. Records reviewed  REVIEW OF SYSTEMS: Full 14 system review of systems performed and notable only for those listed, all others are neg:  Constitutional: neg  Cardiovascular: neg Ear/Nose/Throat: neg  Skin: neg Eyes:  Blurred  visionRespiratory: neg Gastroitestinal: neg  Hematology/Lymphatic: neg  Endocrine: neg Musculoskeletal: muscle spasms Allergy/Immunology: neg Neurological: neg Psychiatric: neg Sleep : neg   ALLERGIES: No Known Allergies  HOME MEDICATIONS: Outpatient Medications Prior to Visit  Medication Sig Dispense Refill  . methocarbamol (ROBAXIN) 500 MG tablet TAKE 1 TABLET BY MOUTH TWICE A DAY AS NEEDED FOR MUSCLE STRAIN/SPASM  2  . nortriptyline (PAMELOR) 25 MG capsule Take 3 capsules (75 mg total) by mouth at bedtime. 90 capsule 6  . promethazine (PHENERGAN) 12.5 MG tablet Take 12.5 mg by mouth daily.    . propranolol ER (INDERAL LA) 80 MG 24 hr capsule Take 1 capsule (80 mg total) by mouth daily. 90 capsule 0  . tiZANidine (ZANAFLEX) 4 MG tablet Take 1 tablet (4 mg total) by mouth every 6 (six) hours as needed for muscle spasms. 30 tablet 6  . zolmitriptan (ZOMIG-ZMT) 5 MG disintegrating tablet Take 1 tablet (5 mg total) by mouth as needed for migraine. 10 tablet 6  . zonisamide (ZONEGRAN) 100 MG capsule One po bid 60 capsule 11  . ALTAVERA 0.15-30 MG-MCG tablet TAKE 1 TABLET BY ORAL ROUTE EVERY DAY  0  . CAMRESE 0.15-0.03 &0.01 MG tablet Take 1 tablet by mouth daily.  2   No facility-administered medications prior to visit.     PAST MEDICAL HISTORY: Past Medical History:  Diagnosis Date  . Back pain   . HA (headache)     PAST SURGICAL HISTORY: Past Surgical History:  Procedure Laterality Date  . None      FAMILY HISTORY: Family History  Problem Relation Age of Onset  . High blood pressure Mother   . Healthy Father     SOCIAL HISTORY: Social History   Social History  . Marital status: Single    Spouse name: Abigail Henry  . Number of children: 2  . Years of education: 12   Occupational History  .  [employer]   Social History Main Topics  . Smoking status: Never Smoker  . Smokeless tobacco: Never Used  . Alcohol use 0.6 oz/week    1 Cans of beer per week     Comment:  2 - 3 glasses on the weekend  . Drug use:      Comment: Marijuana occasionally for migraines  . Sexual activity: Not on file   Other Topics Concern  . Not on file   Social History Narrative   Patient lives at home with her husband Abigail Henry) and two children. Patient works full time. [employer] .     Caffeine- two cups    Right handed.     PHYSICAL EXAM  Vitals:   08/27/15 1514  BP: 116/72  Pulse: 68  Weight: 161 lb (73 kg)  Height: 5\' 3"  (1.6 m)   Body mass index is 28.52 kg/m.  Generalized: Well developed, in no acute distress  Head: normocephalic and atraumatic,. Oropharynx benign  Neck: Supple, no carotid bruits  Cardiac: Regular rate rhythm, no murmur  Musculoskeletal: No deformity   Neurological examination   Mentation: Alert oriented to time, place, history taking. Attention span and concentration appropriate. Recent and remote memory intact.  Follows all commands speech and language fluent.   Cranial nerve II-XII: Pupils were equal round reactive to light extraocular movements were full,  visual field were full on confrontational test. Facial sensation and strength were normal. hearing was intact to finger rubbing bilaterally. Uvula tongue midline. head turning and shoulder shrug were normal and symmetric.Tongue protrusion into cheek strength was normal. Motor: normal bulk and tone, full strength in the BUE, BLE, fine finger movements normal, no pronator drift. No focal weakness Sensory: normal and symmetric to light touch, pinprick, and  Vibration, proprioception  Coordination: finger-nose-finger, heel-to-shin bilaterally, no dysmetria Reflexes: Brachioradialis 2/2, biceps 2/2, triceps 2/2, patellar 2/2, Achilles 2/2, plantar responses were flexor bilaterally. Gait and Station: Rising up from seated position without assistance, normal stance,  moderate stride, good arm swing, smooth turning, able to perform tiptoe, and heel walking without difficulty. Tandem gait is  steady  DIAGNOSTIC DATA (LABS, IMAGING, TESTING)   ASSESSMENT AND PLAN  35 y.o. year old female  has a past medical history of Back pain and HA (headache). here  to follow-up. She has failed Topamax, Imitrex and Maxalt and Relpax in the past. The patient is a current patient of Dr. Terrace Arabia who is out of the office today . This note is sent to the work in doctor.    PLAN: Given a list of migraine triggers  Continue Pamelor and Inderal LA as preventatives  Can take I/2 of Zomig  Acutely I spent additional 15 minutes in total face to face time with the patient more than 50% of which was spent counseling and coordination of care, reviewing test results reviewing medications and discussing and reviewing the diagnosis of migraine and further treatment options. Importance of keeping a diary if headaches worsen to include the time of the headache what you're doing any other specific information that would be useful. Discussed stress relief techniques such as deep breathing muscle relaxation mental relaxation to music. Discussed importance of exercise, regular meals  and sleep. Sleep deprivation can be a migraine trigger Follow-up in  3months Nilda Riggs, [facility], [facility], APRN  [facility] [address], [address] [place], [place] [address] [phone]

## 2015-08-28 NOTE — Progress Notes (Signed)
I have read the note, and I agree with the clinical assessment and plan.  Wilbert Hayashi A. Andrw Mcguirt, MD, PhD Certified in Neurology, Clinical Neurophysiology, Sleep Medicine, Pain Medicine and Neuroimaging  [facility] [address], [address] [place], [place] [address] [phone]  

## 2016-02-10 ENCOUNTER — Other Ambulatory Visit: Payer: Self-pay | Admitting: Neurology

## 2016-02-18 ENCOUNTER — Ambulatory Visit (INDEPENDENT_AMBULATORY_CARE_PROVIDER_SITE_OTHER): Payer: 59 | Admitting: Nurse Practitioner

## 2016-02-18 ENCOUNTER — Encounter: Payer: Self-pay | Admitting: Nurse Practitioner

## 2016-02-18 ENCOUNTER — Other Ambulatory Visit: Payer: Self-pay | Admitting: Nurse Practitioner

## 2016-02-18 VITALS — BP 116/73 | HR 74 | Ht 63.0 in | Wt 162.0 lb

## 2016-02-18 DIAGNOSIS — G43709 Chronic migraine without aura, not intractable, without status migrainosus: Secondary | ICD-10-CM

## 2016-02-18 MED ORDER — TIZANIDINE HCL 4 MG PO TABS: 4.0000 mg | ORAL_TABLET | Freq: Four times a day (QID) | ORAL | 6 refills | Status: DC | PRN

## 2016-02-18 MED ORDER — ZONISAMIDE 100 MG PO CAPS: ORAL_CAPSULE | ORAL | 11 refills | Status: DC

## 2016-02-18 MED ORDER — ZOLMITRIPTAN 5 MG PO TBDP: 5.0000 mg | ORAL_TABLET | ORAL | 6 refills | Status: DC | PRN

## 2016-02-18 MED ORDER — PROPRANOLOL HCL ER 80 MG PO CP24: 80.0000 mg | ORAL_CAPSULE | Freq: Every day | ORAL | 1 refills | Status: DC

## 2016-02-18 MED ORDER — NORTRIPTYLINE HCL 25 MG PO CAPS: 75.0000 mg | ORAL_CAPSULE | Freq: Every day | ORAL | 6 refills | Status: DC

## 2016-02-18 NOTE — Patient Instructions (Addendum)
Will give 1000mg  IV Depakon now Continue Pamelor and Inderal LA as preventatives  Can take I/2 of Zomig  Acutely Discuss Botox with Botox coordinator Follow-up in  3months

## 2016-02-18 NOTE — Progress Notes (Signed)
I have read the note, and I agree with the clinical assessment and plan.  Carlis Burnsworth A. Myrella Fahs, MD, PhD Certified in Neurology, Clinical Neurophysiology, Sleep Medicine, Pain Medicine and Neuroimaging  [facility] [address], [address] [place], [place] [address] [phone]  

## 2016-02-18 NOTE — Progress Notes (Signed)
[facility]  PATIENT: Abigail Henry DOB: [birth year]   REASON FOR VISIT: follow-up for acute migraine  HISTORY FROM: patient    HISTORY OF PRESENT ILLNESS:[name] is a 36 years old right-handed female, seen in refer in October 10 2014 by  her primary care physician Dr. Marcellus Scott for evaluation of low back pain, migraine headaches Patient currently describes his lower extremities in July 2 weeks and will lose her knee doing good arm were doing well  B after she gets speech is ([facility] physiatry 50 every 8 8 C5 I saw there haven't had a chance really look at it is I have reviewed laboratory evaluation in March 2015, normal TSH, vitamin D level was low 9, normal being 30 and above, normal free T4, LDL was 33, cholesterol was 148, normal CBC, CMP, creatinine was 0.9 She had a history of migraine since 2012, usually starting from back of her head, spreading forward to become a pounding headaches, retro-orbital area, with associated light noise sensitivity, lasting for a few hours, she has up to 15 or more headaches days in a month  I have seen her in 2014 for similar complaints, she has tried Imitrex, cause sleepiness, not help her headaches, Maxalt, not effective, she has been taking Relpax as needed, only helped 20% of time, sometimes she take marijuana as needed basis for headaches, Trigger for her headaches are weather change, stress, exertion, sleep deprivation, She has been taking Inderal 20 mg twice a day which has helped her headaches, previously tried nortriptyline was helpful, but she run out of refills, Topamax was no help, allergy pills Zyrtec was not helpful  Since 2012, she also noticed intermittent low back pain, getting worse since January 2016, right side low back pain, radiating to her right hip, right lateral leg, getting worse with prolonged sitting, she denied persistent lower extremity sensory loss or weakness,  she denies gait difficulty, she has no bowel bladder incontinence. Since September 2016 she also has intermittent right hand paresthesia, radiating to right forearm, no weakness, no right facial involvement.  UPDATE Dec 02 2014:YYshe complains of upper back pain, denies radiating pain, no gait difficulty, EMG nerve conduction study October 31 2014 was normal. There was no evidence of peripheral neuropathy, cervical radiculopathy, or right lumbosacral radiculopathy. She still has headaches 2-5 times each week, moderate severe, with associated light noise sensitivity, she think Inderal 20 mg twice a day, nortriptyline 25 mg 2 tablets every night was helpful as preventive medications, Zomig when necessary was effective as abortive treatment.  UPDATE Feb 7th 2017:YYShe has 3-4 headaches each week, pressure, throbbing, with light noise sensitivity, lasting for whole day. Zomig was helpful, but she often has rebound headache few hours later, Since Jan 2017, she also complains of intermittent episodes of bilateral lower extremity pain and feet numbness she denies difficulty walking, no incontinence, she has a lot of nasal congestions UPDATE 08/02/2017CM [name], 36 year old female returns for follow-up. She is having about 1-2 headaches per week however when she takes her Zomig makes her very drowsy especially when she has to go to work. It does eliminate the headache. She is on nortriptyline 75 mg at bedtime and Inderal LA 80 mg daily. She has tizanidine to take when necessary muscle spasms. She is not aware of any specific migraine triggers. 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 She returns for reevaluation. She is previously been seen by Dr. Terrace Arabia. Records reviewed  UPDATE  1/24/2018CM  [name]  36 year old female returns for follow-up with a headache since Sunday.  She was out of town so she did not have her Zomig medication with her.  She finally took it yesterday without effect.   She has not taken anything  today. Her headache today  on a pain scale  of 1-10 is a 10. She has pain and pressure  on the right  side,  Some nausea , no vomiting.  She is sensitive to noise  and light  She remains on her Pamelor, and  Inderal LA.  Her headaches are definitely worse during her cycles which was last week.  She is also interested in talking to the  Botox coordinator. She returns for reevaluaion REVIEW OF SYSTEMS: Full 14 system review of systems performed and notable only for those listed, all others are neg:  Constitutional: neg  Cardiovascular: neg Ear/Nose/Throat: neg  Skin: neg Eyes:  Blurred vision, light sensitivity Respiratory: neg Gastroitestinal: neg  Hematology/Lymphatic: neg  Endocrine: neg Musculoskeletal: neck pain, back pain Allergy/Immunology: neg Neurological:  headache Psychiatric: neg Sleep : neg   ALLERGIES: No Known Allergies  HOME MEDICATIONS: Outpatient Medications Prior to Visit  Medication Sig Dispense Refill  . methocarbamol (ROBAXIN) 500 MG tablet TAKE 1 TABLET BY MOUTH TWICE A DAY AS NEEDED FOR MUSCLE STRAIN/SPASM  2  . nortriptyline (PAMELOR) 25 MG capsule Take 3 capsules (75 mg total) by mouth at bedtime. 90 capsule 6  . promethazine (PHENERGAN) 12.5 MG tablet Take 12.5 mg by mouth daily.    . propranolol ER (INDERAL LA) 80 MG 24 hr capsule Take 1 capsule (80 mg total) by mouth daily. 90 capsule 1  . tiZANidine (ZANAFLEX) 4 MG tablet Take 1 tablet (4 mg total) by mouth every 6 (six) hours as needed for muscle spasms. 30 tablet 6  . zolmitriptan (ZOMIG-ZMT) 5 MG disintegrating tablet Take 1 tablet (5 mg total) by mouth as needed for migraine. 10 tablet 6  . zonisamide (ZONEGRAN) 100 MG capsule One po bid 60 capsule 11  . zolmitriptan (ZOMIG-ZMT) 5 MG disintegrating tablet TAKE 1 TABLET BY MOUTH AS NEEDED FOR MIGRAINE 10 tablet 0  . ALTAVERA 0.15-30 MG-MCG tablet TAKE 1 TABLET BY ORAL ROUTE EVERY DAY  0  . CAMRESE 0.15-0.03 &0.01 MG tablet  Take 1 tablet by mouth daily.  2   No facility-administered medications prior to visit.     PAST MEDICAL HISTORY: Past Medical History:  Diagnosis Date  . Back pain   . HA (headache)     PAST SURGICAL HISTORY: Past Surgical History:  Procedure Laterality Date  . None      FAMILY HISTORY: Family History  Problem Relation Age of Onset  . High blood pressure Mother   . Healthy Father     SOCIAL HISTORY: Social History   Social History  . Marital status: Single    Spouse name: Abigail Henry  . Number of children: 2  . Years of education: 12   Occupational History  .  [employer]   Social History Main Topics  . Smoking status: Never Smoker  . Smokeless tobacco: Never Used  . Alcohol use 0.6 oz/week    1 Cans of beer per week     Comment: 2 - 3 glasses on the weekend  . Drug use: Yes     Comment: Marijuana occasionally for migraines  . Sexual activity: Not on file   Other Topics Concern  . Not on file  Social History Narrative   Patient lives at home with her husband Abigail Senior) and two children. Patient works full time. [employer] .     Caffeine- two cups    Right handed.     PHYSICAL EXAM  Vitals:   02/18/16 0836  BP: 116/73  Pulse: 74  Weight: 162 lb (73.5 kg)  Height: 5\' 3"  (1.6 m)   Body mass index is 28.7 kg/m.  Generalized: Well developed, in no acute distress  Head: normocephalic and atraumatic,. Oropharynx benign  Neck: Supple, no carotid bruits  Musculoskeletal: No deformity   Neurological examination   Mentation: Alert oriented to time, place, history taking. Attention span and concentration appropriate. Recent and remote memory intact.  Follows all commands speech and language fluent.   Cranial nerve II-XII: Pupils were equal round reactive to light extraocular movements were full, visual field were full on confrontational test. Facial sensation and strength were normal. hearing was intact to finger rubbing bilaterally. Uvula tongue midline. head  turning and shoulder shrug were normal and symmetric.Tongue protrusion into cheek strength was normal. Motor: normal bulk and tone, full strength in the BUE, BLE, fine finger movements normal, no pronator drift. No focal weakness Sensory: normal and symmetric to light touch, pinprick, and  Vibration, proprioception  Coordination: finger-nose-finger, heel-to-shin bilaterally, no dysmetria Reflexes: Brachioradialis 2/2, biceps 2/2, triceps 2/2, patellar 2/2, Achilles 2/2, plantar responses were flexor bilaterally. Gait and Station: Rising up from seated position without assistance, normal stance,  moderate stride, good arm swing, smooth turning, able to perform tiptoe, and heel walking without difficulty. Tandem gait is steady  DIAGNOSTIC DATA (LABS, IMAGING, TESTING)   ASSESSMENT AND PLAN  36 y.o. year old female  has a past medical history of Back pain and HA (headache). here  to follow-up. She has failed Topamax, Imitrex and Maxalt and Relpax in the past. She is currently on Pamelor and Inderal LA for preventives.  She has had a headache since Sunday, 10 on the pain scale of 1-10. . The patient is a current patient of Dr. Terrace Arabia who is out of the office today . This note is sent to the work in doctor.    PLAN: Will give IV Depakon 1000mg  today for acute migraine  Continue Pamelor and Inderal LA as preventatives  Can take I/2 of Zomig  Acutely  continue Tizanidine as ordered Keep a record of your headaces Follow-up in  3 months Greater than 50% of time during this 25 minute visit was spent on counseling,explanation of diagnosis, planning of further management, discussion with patient regarding IV medication today  and coordination of care Nilda Riggs, GNP, [facility], APRN Late note headache down to 7 after infusion pain is gone still feels mild pressure  [facility] [address] [place], [place] [address] [phone]

## 2016-02-18 NOTE — Progress Notes (Signed)
Pt in office for visit. Per CM,NP orders, did migraine infusion in office. Placed 24G IV in left AC x1 attempt at 0910. Cleaned with chlorhexidine prior to insertion. +blood return and flushes without resistance. Secured with tegaderm and tape. Pt tolerated well. No complaint of pain upon flushing IV. No visible signs of swelling or bruising. Site clean/dry/intact. Pt rated headache "10/10" on pain scale prior to infusion.  Pushed IV depacon over 5 minutes. Flushed IV with 0.9% normal saline.  Medications administered:  Depacon 500mg /245mLx1st vial Lot: [id number] Expiration: 10/2017 NDC: [id number]  Depacon 500mg /525ml- 2nd vial Lot: [id number] Expiration: 04/2018 NDC: [id number]  IV catherter discontinued at 0930. Catheter intact. Site w/o signs/symptoms of complications. Pt denies pain. No redness/swelling. Dressing and pressure applied.  Pt rated headache "7/10" on pain scale after infusion.

## 2016-03-26 ENCOUNTER — Telehealth: Payer: Self-pay | Admitting: Nurse Practitioner

## 2016-03-26 ENCOUNTER — Telehealth: Payer: Self-pay | Admitting: Neurology

## 2016-03-26 DIAGNOSIS — Z0289 Encounter for other administrative examinations: Secondary | ICD-10-CM

## 2016-03-26 NOTE — Telephone Encounter (Signed)
Pt called said on FMLA #7 needs to be in hours not days. She said if stays as written now if she takes 1 hour they will take the whole day. Pt said she can bring the form if needed. This must be turned in by 03/31/16. Please call

## 2016-03-26 NOTE — Telephone Encounter (Signed)
Returned call to patient to let her know I will update the FMLA paperwork and fax it again to her employer.

## 2016-03-29 NOTE — Telephone Encounter (Signed)
Paperwork corrected and faxed back to employer.  Receipt confirmation received.

## 2016-03-29 NOTE — Telephone Encounter (Signed)
error 

## 2016-03-29 NOTE — Telephone Encounter (Signed)
Previous fax number used is no longer working.  Pt came to our office today and picked the updated paperwork up for her employer.

## 2016-05-18 ENCOUNTER — Ambulatory Visit (INDEPENDENT_AMBULATORY_CARE_PROVIDER_SITE_OTHER): Payer: 59 | Admitting: Nurse Practitioner

## 2016-05-18 ENCOUNTER — Encounter: Payer: Self-pay | Admitting: Nurse Practitioner

## 2016-05-18 VITALS — BP 106/64 | HR 78

## 2016-05-18 DIAGNOSIS — G8929 Other chronic pain: Secondary | ICD-10-CM

## 2016-05-18 DIAGNOSIS — G43709 Chronic migraine without aura, not intractable, without status migrainosus: Secondary | ICD-10-CM

## 2016-05-18 DIAGNOSIS — M545 Low back pain: Secondary | ICD-10-CM | POA: Diagnosis not present

## 2016-05-18 NOTE — Progress Notes (Signed)
[facility]  PATIENT: Abigail Henry DOB: 01/24/1981   REASON FOR VISIT: follow-up for  migraine  HISTORY FROM: patient    HISTORY OF PRESENT ILLNESS:[name] is a 36 years old right-handed female, seen in refer in October 10 2014 by  her primary care physician Dr. Marcellus ScottWilliam Henry for evaluation of low back pain, migraine headaches Patient currently describes his lower extremities in July 2 weeks and will lose her knee doing good arm were doing well  B after she gets speech is (we have the [facility] scheduled physiatry 50 every 8 8 C5 I saw there haven't had a chance really look at it is I have reviewed laboratory evaluation in March 2015, normal TSH, vitamin D level was low 9, normal being 30 and above, normal free T4, LDL was 33, cholesterol was 148, normal CBC, CMP, creatinine was 0.9 She had a history of migraine since 2012, usually starting from back of her head, spreading forward to become a pounding headaches, retro-orbital area, with associated light noise sensitivity, lasting for a few hours, she has up to 15 or more headaches days in a month  I have seen her in 2014 for similar complaints, she has tried Imitrex, cause sleepiness, not help her headaches, Maxalt, not effective, she has been taking Relpax as needed, only helped 20% of time, sometimes she take marijuana as needed basis for headaches, Trigger for her headaches are weather change, stress, exertion, sleep deprivation, She has been taking Inderal 20 mg twice a day which has helped her headaches, previously tried nortriptyline was helpful, but she run out of refills, Topamax was no help, allergy pills Zyrtec was not helpful  Since 2012, she also noticed intermittent low back pain, getting worse since January 2016, right side low back pain, radiating to her right hip, right lateral leg, getting worse with prolonged sitting, she denied persistent lower extremity sensory loss or weakness, she  denies gait difficulty, she has no bowel bladder incontinence. Since September 2016 she also has intermittent right hand paresthesia, radiating to right forearm, no weakness, no right facial involvement.  UPDATE Dec 02 2014:YYshe complains of upper back pain, denies radiating pain, no gait difficulty, EMG nerve conduction study October 31 2014 was normal. There was no evidence of peripheral neuropathy, cervical radiculopathy, or right lumbosacral radiculopathy. She still has headaches 2-5 times each week, moderate severe, with associated light noise sensitivity, she think Inderal 20 mg twice a day, nortriptyline 25 mg 2 tablets every night was helpful as preventive medications, Zomig when necessary was effective as abortive treatment.  UPDATE Feb 7th 2017:YYShe has 3-4 headaches each week, pressure, throbbing, with light noise sensitivity, lasting for whole day. Zomig was helpful, but she often has rebound headache few hours later, Since Jan 2017, she also complains of intermittent episodes of bilateral lower extremity pain and feet numbness she denies difficulty walking, no incontinence, she has a lot of nasal congestions UPDATE 08/02/2017[name], 36 year old female returns for follow-up. She is having about 1-2 headaches per week however when she takes her Zomig makes her very drowsy especially when she has to go to work. It does eliminate the headache. She is on nortriptyline 75 mg at bedtime and Inderal LA 80 mg daily. She has tizanidine to take when necessary muscle spasms. She is not aware of any specific migraine triggers. 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 She returns for reevaluation. She is previously been seen by Dr. Terrace Henry. Records reviewed  UPDATE  1/24/2018CM  Abigail Henry  35 year old female returns for follow-up with a headache since Sunday.  She was out of town so she did not have her Zomig medication with her.  She finally took it yesterday without effect.  She  has not taken anything  today. Her headache today  on a pain scale  of 1-10 is a 10. She has pain and pressure  on the right  side,  Some nausea , no vomiting.  She is sensitive to noise  and light  She remains on her Pamelor, and  Inderal LA.  Her headaches are definitely worse during her cycles which was last week.  She is also interested in talking to the  Botox coordinator. She returns for reevaluaion  UPDATE4/24/18 [name] 36 year old female returns for follow-up with history of migraine headaches. She is currently going through her cycle and she always has a migraine during this time. When last seen she was started on Zonegran but had side effects to the medication, made her feel weird. 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 She also has seasonal allergies. Zomig continues to work acutely for her. She remains on Pamelor and Inderal. She takes Zanaflex when necessary. She has not kept a record of her headaches as requested. She returns for reevaluation REVIEW OF SYSTEMS: Full 14 system review of systems performed and notable only for those listed, all others are neg:  Constitutional: neg  Cardiovascular: neg Ear/Nose/Throat: neg  Skin: neg Eyes:  Blurred vision, Respiratory: neg Gastroitestinal: neg  Hematology/Lymphatic: neg  Endocrine: neg Musculoskeletal: , back pain Allergy/Immunology: neg Neurological:  headache Psychiatric: neg Sleep : neg   ALLERGIES: No Known Allergies  HOME MEDICATIONS: Outpatient Medications Prior to Visit  Medication Sig Dispense Refill  . methocarbamol (ROBAXIN) 500 MG tablet TAKE 1 TABLET BY MOUTH TWICE A DAY AS NEEDED FOR MUSCLE STRAIN/SPASM  2  . nortriptyline (PAMELOR) 25 MG capsule Take 3 capsules (75 mg total) by mouth at bedtime. 90 capsule 6  . promethazine (PHENERGAN) 12.5 MG tablet Take 12.5 mg by mouth daily.    . propranolol ER (INDERAL LA) 80 MG 24 hr capsule Take 1  capsule (80 mg total) by mouth daily. 90 capsule 1  . tiZANidine (ZANAFLEX) 4 MG tablet Take 1 tablet (4 mg total) by mouth every 6 (six) hours as needed for muscle spasms. 30 tablet 6  . zolmitriptan (ZOMIG-ZMT) 5 MG disintegrating tablet Take 1 tablet (5 mg total) by mouth as needed for migraine. 10 tablet 6  . zonisamide (ZONEGRAN) 100 MG capsule One po bid (Patient not taking: Reported on 05/18/2016) 60 capsule 11   No facility-administered medications prior to visit.     PAST MEDICAL HISTORY: Past Medical History:  Diagnosis Date  . Back pain   . HA (headache)     PAST SURGICAL HISTORY: Past Surgical History:  Procedure Laterality Date  . None      FAMILY HISTORY: Family History  Problem Relation Age of Onset  . High blood pressure Mother   . Healthy Father   . Raynaud syndrome Father     in hands    SOCIAL HISTORY: Social History   Social History  . Marital status: Married    Spouse name: [name]  . Number of children: 2  . Years of education: 12   Occupational History  .  [employer]   Social History  Main Topics  . Smoking status: Never Smoker  . Smokeless tobacco: Never Used  . Alcohol use 0.6 oz/week    1 Cans of beer per week     Comment: 2 - 3 glasses on the weekend  . Drug use: Yes    Types: Marijuana     Comment: 05/18/16 Marijuana occasionally for migraines  . Sexual activity: Not on file   Other Topics Concern  . Not on file   Social History Narrative   Patient lives at home with her husband [name]) and two children. Patient works full time. [employer] .     Caffeine- two cups    Right handed.     PHYSICAL EXAM  Vitals:   05/18/16 1008  BP: 106/64  Pulse: 78   There is no height or weight on file to calculate BMI.  Generalized: Well developed, in no acute distress  Head: normocephalic and atraumatic,. Oropharynx benign  Neck: Supple, no carotid bruits  Musculoskeletal: No deformity   Neurological examination   Mentation: Alert  oriented to time, place, history taking. Attention span and concentration appropriate. Recent and remote memory intact.  Follows all commands speech and language fluent.   Cranial nerve II-XII: Pupils were equal round reactive to light extraocular movements were full, visual field were full on confrontational test. Facial sensation and strength were normal. hearing was intact to finger rubbing bilaterally. Uvula tongue midline. head turning and shoulder shrug were normal and symmetric.Tongue protrusion into cheek strength was normal. Motor: normal bulk and tone, full strength in the BUE, BLE, fine finger movements normal, no pronator drift. No focal weakness Sensory: normal and symmetric to light touch, pinprick, and  Vibration, In the upper and lower extremities Coordination: finger-nose-finger, heel-to-shin bilaterally, no dysmetria Reflexes: Symmetric upper and lower, plantar responses were flexor bilaterally. Gait and Station: Rising up from seated position without assistance, normal stance,  moderate stride, good arm swing, smooth turning, able to perform tiptoe, and heel walking without difficulty. Tandem gait is steady  DIAGNOSTIC DATA (LABS, IMAGING, TESTING)   ASSESSMENT AND PLAN  36 y.o. year old female  has a past medical history of Back pain and HA (headache). here  to follow-up. She has failed Topamax, Zonegran, Imitrex and Maxalt and Relpax in the past. She is currently on Pamelor and Inderal LA for preventives.  Her migraine triggers are stress and her menstrual cycle.  PLAN: Continue Pamelor and Inderal LA as preventatives , does not need refills Can take I/2 to 1 of Zomig  Acutely  continue Tizanidine as ordered Take Allegra or Claritin for seasonal allergies Keep a record of your headaces on migraine tracker APP Follow-up in  3 months Greater than 50% of time during this 20 minute visit was spent on counseling,explanation of diagnosis, planning of further management,  discussion with patient regarding the importance of keeping a record of migraines and coordination of care.  Nilda Riggs, GNP, [facility], APRN Late note headache down to 7 after infusion pain is gone still feels mild pressure  [facility] [address] [place], [place] [address] [phone]

## 2016-05-18 NOTE — Patient Instructions (Addendum)
Continue Pamelor and Inderal LA as preventatives  Can take I/2 to 1 of Zomig  Acutely  continue Tizanidine as ordered Keep a record of your headaches migraine tracker APP Take Allegra or Claritin for seasonal allergies Follow-up in  3 months

## 2016-05-19 NOTE — Progress Notes (Signed)
I have reviewed and agreed above plan. 

## 2016-05-20 NOTE — Telephone Encounter (Signed)
This correction has been previously made for the patient's employer.  I called her HR dept [phone]) and spoke to [place].  Her company will need another copy of this update.  We have provided the patient with this copy and she will deliver it to her employer.

## 2016-05-20 NOTE — Telephone Encounter (Signed)
Pt called said insurance is trying to deny FMLA. Again it is #7 in question: she said it should read: 6 events per month, 8 hours per event. Pt is somewhat confused on what they are asking. Please call to discuss

## 2016-08-18 ENCOUNTER — Ambulatory Visit (INDEPENDENT_AMBULATORY_CARE_PROVIDER_SITE_OTHER): Payer: 59 | Admitting: Nurse Practitioner

## 2016-08-18 ENCOUNTER — Encounter: Payer: Self-pay | Admitting: Nurse Practitioner

## 2016-08-18 VITALS — BP 116/70 | HR 87 | Wt 167.0 lb

## 2016-08-18 DIAGNOSIS — G43709 Chronic migraine without aura, not intractable, without status migrainosus: Secondary | ICD-10-CM

## 2016-08-18 MED ORDER — ZOLMITRIPTAN 5 MG PO TBDP: 5.0000 mg | ORAL_TABLET | ORAL | 6 refills | Status: DC | PRN

## 2016-08-18 MED ORDER — TIZANIDINE HCL 4 MG PO TABS: 4.0000 mg | ORAL_TABLET | Freq: Four times a day (QID) | ORAL | 6 refills | Status: DC | PRN

## 2016-08-18 MED ORDER — PROPRANOLOL HCL ER 80 MG PO CP24: 80.0000 mg | ORAL_CAPSULE | Freq: Every day | ORAL | 1 refills | Status: DC

## 2016-08-18 MED ORDER — NORTRIPTYLINE HCL 25 MG PO CAPS: 75.0000 mg | ORAL_CAPSULE | Freq: Every day | ORAL | 6 refills | Status: DC

## 2016-08-18 NOTE — Progress Notes (Signed)
[facility]  PATIENT: Abigail Henry DOB: 01/24/1981   REASON FOR VISIT: follow-up for  migraine  HISTORY FROM: patient    HISTORY OF PRESENT ILLNESS:[name] is a 36 years old right-handed female, seen in refer in October 10 2014 by  her primary care physician Dr. Marcellus ScottWilliam Hancock for evaluation of low back pain, migraine headaches Patient currently describes his lower extremities in July 2 weeks and will lose her knee doing good arm were doing well  B after she gets speech is ([facility] physiatry 50 every 8 8 C5 I saw there haven't had a chance really look at it is I have reviewed laboratory evaluation in March 2015, normal TSH, vitamin D level was low 9, normal being 30 and above, normal free T4, LDL was 33, cholesterol was 148, normal CBC, CMP, creatinine was 0.9 She had a history of migraine since 2012, usually starting from back of her head, spreading forward to become a pounding headaches, retro-orbital area, with associated light noise sensitivity, lasting for a few hours, she has up to 15 or more headaches days in a month  I have seen her in 2014 for similar complaints, she has tried Imitrex, cause sleepiness, not help her headaches, Maxalt, not effective, she has been taking Relpax as needed, only helped 20% of time, sometimes she take marijuana as needed basis for headaches, Trigger for her headaches are weather change, stress, exertion, sleep deprivation, She has been taking Inderal 20 mg twice a day which has helped her headaches, previously tried nortriptyline was helpful, but she run out of refills, Topamax was no help, allergy pills Zyrtec was not helpful  Since 2012, she also noticed intermittent low back pain, getting worse since January 2016, right side low back pain, radiating to her right hip, right lateral leg, getting worse with prolonged sitting, she denied persistent lower extremity sensory loss or weakness, she  denies gait difficulty, she has no bowel bladder incontinence. Since September 2016 she also has intermittent right hand paresthesia, radiating to right forearm, no weakness, no right facial involvement.  UPDATE Dec 02 2014:YYshe complains of upper back pain, denies radiating pain, no gait difficulty, EMG nerve conduction study October 31 2014 was normal. There was no evidence of peripheral neuropathy, cervical radiculopathy, or right lumbosacral radiculopathy. She still has headaches 2-5 times each week, moderate severe, with associated light noise sensitivity, she think Inderal 20 mg twice a day, nortriptyline 25 mg 2 tablets every night was helpful as preventive medications, Zomig when necessary was effective as abortive treatment.  UPDATE Feb 7th 2017:YYShe has 3-4 headaches each week, pressure, throbbing, with light noise sensitivity, lasting for whole day. Zomig was helpful, but she often has rebound headache few hours later, Since Jan 2017, she also complains of intermittent episodes of bilateral lower extremity pain and feet numbness she denies difficulty walking, no incontinence, she has a lot of nasal congestions UPDATE 08/02/2017[name], 36 year old female returns for follow-up. She is having about 1-2 headaches per week however when she takes her Zomig makes her very drowsy especially when she has to go to work. It does eliminate the headache. She is on nortriptyline 75 mg at bedtime and Inderal LA 80 mg daily. She has tizanidine to take when necessary muscle spasms. She is not aware of any specific migraine triggers. 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 She returns for reevaluation. She is previously been seen by Dr. Terrace ArabiaYan. Records reviewed  UPDATE  1/24/2018CM  Abigail Henry  36 year old female returns for follow-up with a headache since Sunday.  She was out of town so she did not have her Zomig medication with her.  She finally took it yesterday without effect.  She  has not taken anything  today. Her headache today  on a pain scale  of 1-10 is a 10. She has pain and pressure  on the right  side,  Some nausea , no vomiting.  She is sensitive to noise  and light  She remains on her Pamelor, and  Inderal LA.  Her headaches are definitely worse during her cycles which was last week.  She is also interested in talking to the  Botox coordinator. She returns for reevaluaion  UPDATE4/24/18 [name] 36 year old female returns for follow-up with history of migraine headaches. She is currently going through her cycle and she always has a migraine during this time. When last seen she was started on Zonegran but had side effects to the medication, made her feel weird. 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 She also has seasonal allergies. Zomig continues to work acutely for her. She remains on Pamelor and Inderal. She takes Zanaflex when necessary. She has not kept a record of her headaches as requested. She returns for reevaluation UPDATE 07/25/2018[name], 36 year old female returns for follow-up with history of migraine headaches. Since going to a chiropractor and taking allergy medications her headaches have improved along with taking her preventive medications as well. Weather changes are still a trigger. Her preventatives  Are Pamelor Inderal and tizanidine. She takes Zomig acutely. She has kept a record but did not bring it today as requested. She was asked to mail it in.   REVIEW OF SYSTEMS: Full 14 system review of systems performed and notable only for those listed, all others are neg:  Constitutional: neg  Cardiovascular: neg Ear/Nose/Throat: neg  Skin: neg Eyes:  Blurred vision, light sensitivity Respiratory: neg Gastroitestinal: neg  Hematology/Lymphatic: neg  Endocrine: neg Musculoskeletal: , back pain, neck pain treated by chiropractor Allergy/Immunology: neg Neurological:   headache Psychiatric: neg Sleep : neg   ALLERGIES: Allergies  Allergen Reactions  . Zonegran [Zonisamide] Other (See Comments)    hallucinating    HOME MEDICATIONS: Outpatient Medications Prior to Visit  Medication Sig Dispense Refill  . diphenhydrAMINE (BENADRYL) 25 MG tablet Take 25 mg by mouth every 6 (six) hours as needed.    . loratadine (CLARITIN) 10 MG tablet Take 10 mg by mouth as needed for allergies.    . methocarbamol (ROBAXIN) 500 MG tablet TAKE 1 TABLET BY MOUTH TWICE A DAY AS NEEDED FOR MUSCLE STRAIN/SPASM  2  . nortriptyline (PAMELOR) 25 MG capsule Take 3 capsules (75 mg total) by mouth at bedtime. 90 capsule 6  . promethazine (PHENERGAN) 12.5 MG tablet Take 12.5 mg by mouth daily.    . propranolol ER (INDERAL LA) 80 MG 24 hr capsule Take 1 capsule (80 mg total) by mouth daily. 90 capsule 1  . tiZANidine (ZANAFLEX) 4 MG tablet Take 1 tablet (4 mg total) by mouth every 6 (six) hours as needed for muscle spasms. 30 tablet 6  . zolmitriptan (ZOMIG-ZMT) 5 MG disintegrating tablet Take 1 tablet (5 mg total) by mouth as needed for migraine. 10 tablet 6  . zonisamide (ZONEGRAN) 100 MG capsule One po bid (Patient not taking: Reported on 05/18/2016) 60 capsule  11   No facility-administered medications prior to visit.     PAST MEDICAL HISTORY: Past Medical History:  Diagnosis Date  . Back pain   . HA (headache)     PAST SURGICAL HISTORY: Past Surgical History:  Procedure Laterality Date  . None      FAMILY HISTORY: Family History  Problem Relation Age of Onset  . High blood pressure Mother   . Healthy Father   . Raynaud syndrome Father        in hands    SOCIAL HISTORY: Social History   Social History  . Marital status: Married    Spouse name: [name]  . Number of children: 2  . Years of education: 12   Occupational History  .  [employer]   Social History Main Topics  . Smoking status: Never Smoker  . Smokeless tobacco: Never Used  . Alcohol use 0.6  oz/week    1 Cans of beer per week     Comment: 2 - 3 glasses on the weekend  . Drug use: Yes    Types: Marijuana     Comment: 05/18/16 Marijuana occasionally for migraines  . Sexual activity: Not on file   Other Topics Concern  . Not on file   Social History Narrative   Patient lives at home with her husband [name]) and two children. Patient works full time. [employer] .     Caffeine- two cups    Right handed.     PHYSICAL EXAM  Vitals:   08/18/16 1433  BP: 116/70  Pulse: 87  Weight: 167 lb (75.8 kg)   Body mass index is 29.58 kg/m.  Generalized: Well developed, in no acute distress  Head: normocephalic and atraumatic,. Oropharynx benign  Neck: Supple, no carotid bruits  Musculoskeletal: No deformity   Neurological examination   Mentation: Alert oriented to time, place, history taking. Attention span and concentration appropriate. Recent and remote memory intact.  Follows all commands speech and language fluent.   Cranial nerve II-XII: Pupils were equal round reactive to light extraocular movements were full, visual field were full on confrontational test. Facial sensation and strength were normal. hearing was intact to finger rubbing bilaterally. Uvula tongue midline. head turning and shoulder shrug were normal and symmetric.Tongue protrusion into cheek strength was normal. Motor: normal bulk and tone, full strength in the BUE, BLE, fine finger movements normal, no pronator drift. No focal weakness Sensory: normal and symmetric to light touch, pinprick, and  Vibration, In the upper and lower extremities Coordination: finger-nose-finger, heel-to-shin bilaterally, no dysmetria Reflexes: Symmetric upper and lower, plantar responses were flexor bilaterally. Gait and Station: Rising up from seated position without assistance, normal stance,  moderate stride, good arm swing, smooth turning, able to perform tiptoe, and heel walking without difficulty. Tandem gait is  steady  DIAGNOSTIC DATA (LABS, IMAGING, TESTING)   ASSESSMENT AND PLAN  37 y.o. year old female  has a past medical history of Back pain and HA (headache). here  to follow-up. She has failed Topamax, Zonegran, Imitrex and Maxalt and Relpax in the past. She is currently on Pamelor and Inderal LA for preventives in tizanidine.  Her migraine triggers are stress and her menstrual cycle.  PLAN: Continue Pamelor and Inderal LA as preventatives , does not need refills Can take I/2 to 1 of Zomig  Acutely  continue Tizanidine as ordered Take Allegra or Claritin for seasonal allergies Send me your current  record of your headaches  Follow-up in  6 months Greater than 50% of time during this 20 minute visit was spent on counseling,explanation of diagnosis, planning of further management, discussion with patient regarding the importance of keeping a record of migraines and coordination of care.  Nilda RiggsNancy Carolyn Martin, [facility] Hospital[facility]NP, [facility], APRN [facility] [address] [place], [place] [address] [phone]

## 2016-08-18 NOTE — Patient Instructions (Signed)
Continue Pamelor and Inderal LA as preventatives , does not need refills Can take I/2 to 1 of Zomig  Acutely  continue Tizanidine as ordered Take Allegra or Claritin for seasonal allergies Send me your current  record of your headaches  Follow-up in  6 months

## 2016-08-19 NOTE — Progress Notes (Signed)
I have reviewed and agreed above plan. 

## 2016-09-14 ENCOUNTER — Telehealth: Payer: Self-pay | Admitting: *Deleted

## 2016-09-14 NOTE — Telephone Encounter (Addendum)
Spoke to patient - she has 40 hours of FMLA time provided to her for migraines and low back pain.  Recently, her employer also allowed her one additional day per month, giving her 48 hours per month.  She dropped off paperwork this week stating she has missed partial or full work days for 10 more days that were over and beyond her provided FMLA hours.  She has been offered Botox in the past but never called back to schedule it.  Also, she is now seeing a chiropractor for her back pain.  I have scheduled an appt to further discuss her medical conditions with Dr. Terrace Arabia.  If she is now under the care of a chiropractor for her back pain, then it would be more appropriate for that MD to be responsible for determining FMLA time needed for that condition.  She will need to further discuss the amount of time needed for her migraines at her follow up.  Her paperwork, note requesting additional time and migraine journal will be filed for further review with MD.

## 2016-09-14 NOTE — Telephone Encounter (Signed)
Returned call to patient - left another message requesting a return call.

## 2016-09-14 NOTE — Telephone Encounter (Signed)
Left message for a return call - need to discuss her FMLA paperwork we received.

## 2016-09-14 NOTE — Telephone Encounter (Signed)
Patient called office returning RN's call to discuss FMLA paperwork.  Please call

## 2016-09-17 NOTE — Telephone Encounter (Signed)
Patient called office requesting to speak with Marcelino Duster in reference to her FMLA paperwork.  Please call

## 2016-09-20 NOTE — Telephone Encounter (Signed)
Returned call to patient - she would like to come in earlier to discuss her migraines.  Her appt has been moved to an available time on 09/21/16.

## 2016-09-21 ENCOUNTER — Encounter: Payer: Self-pay | Admitting: Neurology

## 2016-09-21 ENCOUNTER — Ambulatory Visit (INDEPENDENT_AMBULATORY_CARE_PROVIDER_SITE_OTHER): Payer: 59 | Admitting: Neurology

## 2016-09-21 VITALS — BP 128/80 | HR 88 | Ht 63.0 in | Wt 166.8 lb

## 2016-09-21 DIAGNOSIS — G43709 Chronic migraine without aura, not intractable, without status migrainosus: Secondary | ICD-10-CM | POA: Diagnosis not present

## 2016-09-21 DIAGNOSIS — G8929 Other chronic pain: Secondary | ICD-10-CM

## 2016-09-21 DIAGNOSIS — M545 Low back pain: Secondary | ICD-10-CM | POA: Diagnosis not present

## 2016-09-21 MED ORDER — ZOLMITRIPTAN 5 MG PO TABS: 5.0000 mg | ORAL_TABLET | ORAL | 11 refills | Status: AC | PRN

## 2016-09-21 MED ORDER — SUMATRIPTAN SUCCINATE 6 MG/0.5ML ~~LOC~~ SOLN: 6.0000 mg | SUBCUTANEOUS | 11 refills | Status: DC | PRN

## 2016-09-21 NOTE — Progress Notes (Signed)
[facility]  PATIENT: Abigail Henry DOB: [birth year]   HISTORY OF PRESENT ILLNESS:Abigail Henry is a 36 years old right-handed female, seen in refer in October 10 2014 by  her primary care physician Dr. Marcellus Scott for evaluation of low back pain, migraine headaches Patient currently describes his lower extremities in July 2 weeks and will lose her knee doing good arm were doing well  B after she gets speech is (we have the biggest things scheduled physiatry 50 every 8 8 C5 I saw there haven't had a chance really look at it is I have reviewed laboratory evaluation in March 2015, normal TSH, vitamin D level was low 9, normal being 30 and above, normal free T4, LDL was 33, cholesterol was 148, normal CBC, CMP, creatinine was 0.9 She had a history of migraine since 2012, usually starting from back of her head, spreading forward to become a pounding headaches, retro-orbital area, with associated light noise sensitivity, lasting for a few hours, she has up to 15 or more headaches days in a month  I have seen her in 2014 for similar complaints, she has tried Imitrex, cause sleepiness, not help her headaches, Maxalt, not effective, she has been taking Relpax as needed, only helped 20% of time, sometimes she take marijuana as needed basis for headaches, Trigger for her headaches are weather change, stress, exertion, sleep deprivation, She has been taking Inderal 20 mg twice a day which has helped her headaches, previously tried nortriptyline was helpful, but she run out of refills, Topamax was no help, allergy pills Zyrtec was not helpful  Since 2012, she also noticed intermittent low back pain, getting worse since January 2016, right side low back pain, radiating to her right hip, right lateral leg, getting worse with prolonged sitting, she denied persistent lower extremity sensory loss or weakness, she denies gait difficulty, she has no bowel bladder incontinence. Since  September 2016 she also has intermittent right hand paresthesia, radiating to right forearm, no weakness, no right facial involvement.  UPDATE Dec 02 2014:YYshe complains of upper back pain, denies radiating pain, no gait difficulty, EMG nerve conduction study October 31 2014 was normal. There was no evidence of peripheral neuropathy, cervical radiculopathy, or right lumbosacral radiculopathy. She still has headaches 2-5 times each week, moderate severe, with associated light noise sensitivity, she think Inderal 20 mg twice a day, nortriptyline 25 mg 2 tablets every night was helpful as preventive medications, Zomig when necessary was effective as abortive treatment.  UPDATE Feb 7th 2017:YYShe has 3-4 headaches each week, pressure, throbbing, with light noise sensitivity, lasting for whole day. 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   ALLERGIES: Allergies  Allergen  Reactions  . Zonegran [Zonisamide] Other (See Comments)    hallucinating    HOME MEDICATIONS: Outpatient Medications Prior to Visit  Medication Sig Dispense Refill  . diphenhydrAMINE (BENADRYL) 25 MG  tablet Take 25 mg by mouth every 6 (six) hours as needed.    . loratadine (CLARITIN) 10 MG tablet Take 10 mg by mouth as needed for allergies.    . methocarbamol (ROBAXIN) 500 MG tablet TAKE 1 TABLET BY MOUTH TWICE A DAY AS NEEDED FOR MUSCLE STRAIN/SPASM  2  . nortriptyline (PAMELOR) 25 MG capsule Take 3 capsules (75 mg total) by mouth at bedtime. 90 capsule 6  . promethazine (PHENERGAN) 12.5 MG tablet Take 12.5 mg by mouth daily.    . propranolol ER (INDERAL LA) 80 MG 24 hr capsule Take 1 capsule (80 mg total) by mouth daily. 90 capsule 1  . tiZANidine (ZANAFLEX) 4 MG tablet Take 1 tablet (4 mg total) by mouth every 6 (six) hours as needed for muscle spasms. 60 tablet 6  . zolmitriptan (ZOMIG-ZMT) 5 MG disintegrating tablet Take 1 tablet (5 mg total) by mouth as needed for migraine. 10 tablet 6   No facility-administered medications prior to visit.     PAST MEDICAL HISTORY: Past Medical History:  Diagnosis Date  . Back pain   . HA (headache)     PAST SURGICAL HISTORY: Past Surgical History:  Procedure Laterality Date  . None      FAMILY HISTORY: Family History  Problem Relation Age of Onset  . High blood pressure Mother   . Healthy Father   . Raynaud syndrome Father        in hands    SOCIAL HISTORY: Social History   Social History  . Marital status: Married    Spouse name: [name]  . Number of children: 2  . Years of education: 12   Occupational History  .  [employer]   Social History Main Topics  . Smoking status: Never Smoker  . Smokeless tobacco: Never Used  . Alcohol use 0.6 oz/week    1 Cans of beer per week     Comment: 2 - 3 glasses on the weekend  . Drug use: Yes    Types: Marijuana     Comment: 05/18/16 Marijuana occasionally for migraines  . Sexual activity: Not on file   Other Topics Concern  . Not on file   Social History Narrative   Patient lives at home with her husband [name]) and two children. Patient works full time. [employer] .      Caffeine- two cups    Right handed.     PHYSICAL EXAM  Vitals:   09/21/16 1153  BP: 128/80  Pulse: 88  Weight: 166 lb 12 oz (75.6 kg)  Height: 5\' 3"  (1.6 m)   Body mass index is 29.54 kg/m.  Generalized: Well developed, in no acute distress  Head: normocephalic and atraumatic,. Oropharynx benign  Neck: Supple, no carotid bruits  Musculoskeletal: No deformity   Neurological examination    PHYSICAL EXAMNIATION:  Gen: NAD, conversant, well nourised, obese, well groomed                     Cardiovascular: Regular rate rhythm, no peripheral edema, warm, nontender. Eyes: Conjunctivae clear without exudates or hemorrhage Neck: Supple, no carotid bruits. Pulmonary: Clear to auscultation bilaterally   NEUROLOGICAL EXAM:  MENTAL STATUS: Speech:    Speech is normal; fluent and spontaneous with normal comprehension.  Cognition:  Orientation to time, place and person     Normal recent and remote memory     Normal Attention span and concentration     Normal Language, naming, repeating,spontaneous speech     Fund of knowledge   CRANIAL NERVES: CN II: Visual fields are full to confrontation. Fundoscopic exam is normal with sharp discs and no vascular changes. Pupils are round equal and briskly reactive to light. CN III, IV, VI: extraocular movement are normal. No ptosis. CN V: Facial sensation is intact to pinprick in all 3 divisions bilaterally. Corneal responses are intact.  CN VII: Face is symmetric with normal eye closure and smile. CN VIII: Hearing is normal to rubbing fingers CN IX, X: Palate elevates symmetrically. Phonation is normal. CN XI: Head turning and shoulder shrug are intact CN XII: Tongue is midline with normal movements and no atrophy.  MOTOR: There is no pronator drift of out-stretched arms. Muscle bulk and tone are normal. Muscle strength is normal.  REFLEXES: Reflexes are 2+ and symmetric at the biceps, triceps, knees, and ankles. Plantar responses  are flexor.  SENSORY: Intact to light touch, pinprick, positional and vibratory sensation are intact in fingers and toes.  COORDINATION: Rapid alternating movements and fine finger movements are intact. There is no dysmetria on finger-to-nose and heel-knee-shin.    GAIT/STANCE: Posture is normal. Gait is steady with normal steps, base, arm swing, and turning. Heel and toe walking are normal. Tandem gait is normal.  Romberg is absent.    DIAGNOSTIC DATA (LABS, IMAGING, TESTING)   ASSESSMENT AND PLAN  36 y.o. year old female   Chronic migraine headaches  Suboptimal control, continue have more than 15 headaches a single month   Continue nortriptyline 25 mg 3 tablets every night, Inderal LA 80 mg daily  Zomig as needed  Preauthorization for Botox as migraine prevention   Levert Feinstein, M.D. Ph.D.  [facility] [address] [place], [place] [address] Phone: [phone] Fax:      [phone]

## 2016-09-29 NOTE — Telephone Encounter (Signed)
Patients calling regarding FMLA paperwork.

## 2016-09-29 NOTE — Telephone Encounter (Addendum)
Spoke to patient - dates updated on FMLA paperwork and will fax to her employer.  No additional hours granted.

## 2016-09-30 NOTE — Telephone Encounter (Signed)
I have attempted to fax her paperwork to her employer at the number we have previously used [phone]).  I have tried six times and received a failed receipt notification each time.  I have place the paperwork in the mail to them at: [employer], [address], [address], [place] [id number].  I have also call the patient and left message notifying her of this issue.  We are happy to fax it to another number, if she would like to call back to provide that information.

## 2016-10-14 ENCOUNTER — Ambulatory Visit: Payer: Self-pay | Admitting: Neurology

## 2016-10-15 NOTE — Telephone Encounter (Signed)
Patient called office in reference to [facility] paperwork that is needing to be updated.  There is 1 day that is missing that Dr. Terrace Arabia approved it was 09/19/16 and needs to be 09/20/16 and on question 6 about the lights if we can put "perminant" if able paperwork need to be updated and sent back if possibly by end of Monday.  Please call patient

## 2016-10-15 NOTE — Telephone Encounter (Signed)
Left message letting her know the paperwork will be updated and faxed to her employer on Monday.

## 2016-10-18 NOTE — Telephone Encounter (Signed)
Paperwork updated and faxed to her employer.

## 2016-11-08 ENCOUNTER — Telehealth: Payer: Self-pay | Admitting: Neurology

## 2016-11-08 NOTE — Telephone Encounter (Signed)
Pt wants to r/s botox appt to November. Please call

## 2016-11-09 NOTE — Telephone Encounter (Signed)
Patient has been rescheduled.

## 2016-11-10 ENCOUNTER — Ambulatory Visit: Payer: 59 | Admitting: Neurology

## 2016-11-15 ENCOUNTER — Other Ambulatory Visit (HOSPITAL_COMMUNITY): Payer: PRIVATE HEALTH INSURANCE | Attending: Psychiatry | Admitting: Psychiatry

## 2016-11-15 ENCOUNTER — Encounter (HOSPITAL_COMMUNITY): Payer: Self-pay | Admitting: Psychiatry

## 2016-11-15 DIAGNOSIS — F419 Anxiety disorder, unspecified: Secondary | ICD-10-CM | POA: Insufficient documentation

## 2016-11-15 DIAGNOSIS — F321 Major depressive disorder, single episode, moderate: Secondary | ICD-10-CM | POA: Diagnosis present

## 2016-11-15 NOTE — Progress Notes (Signed)
Psychiatric Initial Adult Assessment   Patient Identification: Abigail Henry MRN:  [id number] Date of Evaluation:  11/15/2016 Referral Source: self Chief Complaint: depression and stress anxiety  Visit Diagnosis:    ICD-10-CM   1. 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  She was apologetically tearful, saying she is worried a lot and cannot turn off her worrying thoughts, decreased energy, wishing to avoid others, trying hard to put on a good front, wanting to sleep, being more irritable and feeling somewhat guilty for not doing better.  No history of psychiatric intervention till now.  The stresses are her husband losing his job in Sept because the company closed.  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  She hates sales and the quotas and says as much as she wants to quit she cannot because they now have 2 children.  Some deaths in the family over the years she bottled up but those are minor compared to the above stresses.  Her father's health is deteriorating and that worries her.    Associated Signs/Symptoms: Depression Symptoms:  depressed mood, anhedonia, insomnia, hypersomnia, fatigue, feelings of worthlessness/guilt, difficulty concentrating, anxiety, loss of energy/fatigue, (Hypo) Manic Symptoms:  Irritable Mood, Anxiety Symptoms:  Excessive Worry, Psychotic Symptoms:  none PTSD Symptoms: Negative  Past Psychiatric History: none  Previous Psychotropic Medications: No   Substance Abuse History in the last 12 months:  No.  Consequences of Substance Abuse: Negative  Past Medical History:  Past Medical History:  Diagnosis Date  . Back pain   . HA (headache)     Past Surgical History:   Procedure Laterality Date  . None      Family Psychiatric History: none of significance  Family History:  Family History  Problem Relation Age of Onset  . High blood pressure Mother   . Healthy Father   . Raynaud syndrome Father        in hands    Social History:   Social History   Social History  . Marital status: Married    Spouse name: [name]  . Number of children: 2  . Years of education: 12   Occupational History  .  [employer]   Social History Main Topics  . Smoking status: Never Smoker  . Smokeless tobacco: Never Used  . Alcohol use 0.6 oz/week    1 Cans of beer per week     Comment: 2 - 3 glasses on the weekend  . Drug use: Yes    Types: Marijuana     Comment: 05/18/16 Marijuana occasionally for migraines  . Sexual activity: Not Asked   Other Topics Concern  . None   Social History Narrative   Patient lives at home with her husband [name]) and two children. Patient works full time. [employer] .     Caffeine- two cups    Right handed.    Additional Social History: good childhood, supportive family and good husband  Allergies:   Allergies  Allergen Reactions  . Zonegran [Zonisamide] Other (See Comments)    hallucinating    Metabolic Disorder Labs: No results found for: HGBA1C, MPG No results found for: PROLACTIN No results found for: CHOL,  TRIG, HDL, CHOLHDL, VLDL, LDLCALC   Current Medications: Current Outpatient Prescriptions  Medication Sig Dispense Refill  . diphenhydrAMINE (BENADRYL) 25 MG tablet Take 25 mg by mouth every 6 (six) hours as needed.    . loratadine (CLARITIN) 10 MG tablet Take 10 mg by mouth as needed for allergies.    . methocarbamol (ROBAXIN) 500 MG tablet TAKE 1 TABLET BY MOUTH TWICE A DAY AS NEEDED FOR MUSCLE STRAIN/SPASM  2  . nortriptyline (PAMELOR) 25 MG capsule Take 3 capsules (75 mg total) by mouth at bedtime. 90 capsule 6  . promethazine (PHENERGAN) 12.5 MG tablet Take 12.5 mg by mouth daily.    . propranolol ER  (INDERAL LA) 80 MG 24 hr capsule Take 1 capsule (80 mg total) by mouth daily. 90 capsule 1  . SUMAtriptan (IMITREX) 6 MG/0.5ML SOLN injection Inject 0.5 mLs (6 mg total) into the skin every 2 (two) hours as needed for migraine or headache. May repeat in 2 hours if headache persists or recurs. 12 vial 11  . tiZANidine (ZANAFLEX) 4 MG tablet Take 1 tablet (4 mg total) by mouth every 6 (six) hours as needed for muscle spasms. 60 tablet 6  . zolmitriptan (ZOMIG) 5 MG tablet Take 1 tablet (5 mg total) by mouth as needed for migraine. 12 tablet 11   No current facility-administered medications for this visit.     Neurologic: Headache: significant migraines not responding to medication so far Seizure: Negative Paresthesias:No  Musculoskeletal: Strength & Muscle Tone: within normal limits Gait & Station: normal Patient leans: N/A  Psychiatric Specialty Exam: ROS  There were no vitals taken for this visit.There is no height or weight on file to calculate BMI.  General Appearance: Well Groomed  Eye Contact:  Good  Speech:  Clear and Coherent  Volume:  Normal  Mood:  Anxious and Depressed  Affect:  Congruent  Thought Process:  Coherent and Goal Directed  Orientation:  Full (Time, Place, and Person)  Thought Content:  Logical  Suicidal Thoughts:  No  Homicidal Thoughts:  No  Memory:  Immediate;   Good Recent;   Good Remote;   Good  Judgement:  Good  Insight:  Good  Psychomotor Activity:  Normal  Concentration:  Concentration: Good and Attention Span: Good  Recall:  Good  Fund of Knowledge:Good  Language: Good  Akathisia:  Negative  Handed:  Right  AIMS (if indicated):  0  Assets:  Communication Skills Desire for Improvement Financial Resources/Insurance Housing Intimacy Leisure Time Physical Health Resilience Social Support Talents/Skills Transportation Vocational/Educational  ADL's:  Intact  Cognition: WNL  Sleep:  poor    Treatment Plan Summary: Admit to IOP with  daily group therapy.  No medications at this point as she will likely respond to the group therapy, but consider SSRI or SNRI vs something more specific for anxiety   Carolanne GrumblingGerald Taylor, MD 10/22/201812:13 PM

## 2016-11-15 NOTE — Progress Notes (Signed)
Comprehensive Clinical Assessment (CCA) Note  11/15/2016 Abigail Henry [id number]  Visit Diagnosis:      ICD-10-CM   1. 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  (See scanned document in Chart Review)  CCA Part Two A  Intake/Chief Complaint:  CCA Intake With Chief Complaint CCA Part Two Date: 11/15/16 CCA Part Two Time: 1514 Chief Complaint/Presenting Problem: This is a 36 yr old, married, employed, [place], who was a self referral; treatment for worsening depressive symptoms.  Denies SI/HI or A/V hallucinations.  According to pt, her symptoms worsened since September 2018.  Triggers/Stressors:  1) Financial Strain:  In September 2018; husband's job closed Engineer, structural([employer]).  He had been there 1 1/2 yrs and enjoyed working there.  Currently looking for a job, but has had no luck.  2)  Father's illness:  He has Raynaud's Disease.  He is in pain a lot.  "He has always been the strong parent and it is very hard to see him going through this."  Parent's reside in [place], [place].  3)  Job of eleven yrs.  Hasn't been meeting quotas.  "I have to sale now and it's not in my nature to take advantage of customers."  4)  Health Issues:  Migraines.  Pt states she doesn't know the triggers, but is having them frequently.  According to pt, the migraines have triggered her back to be in pain.  "Now I have to see a chiropractor."  Pt denies any prior psychiatric hospitalizations or suicide attempts/gestures.  Denies ever seeing a psychiatrist or therapist before.  Family hx:  Maternal Uncle:  ETOH                                                                                             Patients Currently Reported Symptoms/Problems: Sadness, irritability, tearful, poor sleep, ruminating thoughts, anhedonia, isolative, poor concentration Collateral Involvement: Reports that husband, parents and family are very  supportive. Individual's Strengths: Patient is very open. Individual's Abilities: Having a positive attitude. Type of Services Patient Feels Are Needed: MH-IOP  Mental Health Symptoms Depression:  Depression: Change in energy/activity, Difficulty Concentrating, Increase/decrease in appetite, Irritability, Sleep (too much or little), Tearfulness  Mania:  Mania: N/A  Anxiety:   Anxiety: Worrying  Psychosis:  Psychosis: N/A  Trauma:  Trauma: N/A  Obsessions:  Obsessions: N/A  Compulsions:  Compulsions: N/A  Inattention:  Inattention: N/A  Hyperactivity/Impulsivity:  Hyperactivity/Impulsivity: N/A  Oppositional/Defiant Behaviors:  Oppositional/Defiant Behaviors: N/A  Borderline Personality:  Emotional Irregularity: N/A  Other Mood/Personality Symptoms:      Mental Status Exam Appearance and self-care  Stature:  Stature: Average  Weight:  Weight: Average weight  Clothing:  Clothing: Casual  Grooming:  Grooming: Normal  Cosmetic use:  Cosmetic Use: None  Posture/gait:  Posture/Gait: Normal  Motor activity:  Motor Activity: Not Remarkable  Sensorium  Attention:  Attention: Normal  Concentration:  Concentration: Normal  Orientation:  Orientation: X5  Recall/memory:  Recall/Memory: Normal  Affect and Mood  Affect:  Affect:  Appropriate  Mood:  Mood: Depressed  Relating  Eye contact:  Eye Contact: Normal  Facial expression:  Facial Expression: Responsive  Attitude toward examiner:  Attitude Toward Examiner: Cooperative  Thought and Language  Speech flow: Speech Flow: Normal  Thought content:  Thought Content: Appropriate to mood and circumstances  Preoccupation:     Hallucinations:     Organization:     Company secretary of Knowledge:  Fund of Knowledge: Average  Intelligence:  Intelligence: Average  Abstraction:  Abstraction: Normal  Judgement:  Judgement: Normal  Reality Testing:  Reality Testing: Adequate  Insight:  Insight: Good  Decision Making:  Decision  Making: Normal  Social Functioning  Social Maturity:  Social Maturity: Responsible  Social Judgement:  Social Judgement: Normal  Stress  Stressors:  Stressors: Illness, Arts administrator, Work  Coping Ability:  Coping Ability: Building surveyor Deficits:     Supports:      Family and Psychosocial History: Family history Marital status: Married Number of Years Married: 8 What types of issues is patient dealing with in the relationship?: Although pt states husband is supportive; she reports he is always telling her to "Man Up." Are you sexually active?: Yes What is your sexual orientation?: heterosexual Does patient have children?: Yes How many children?: 2 How is patient's relationship with their children?: 56 yr old son and a 57 yro old daughter  Childhood History:  Childhood History By whom was/is the patient raised?: Both parents Additional childhood history information: Pt was born in [place], [place].  Parents didn't marry until pt was age 7.  Pt states that her Paternal Grandmother didn't accept her.  "I don't know what the problem was.  I was told it was because I wasn't light enough.  She ended up dying due to complications with Alzheimer's." Patient's description of current relationship with people who raised him/her: Pt states she is equally close to both parents. Does patient have siblings?: Yes Number of Siblings: 8 Description of patient's current relationship with siblings: Not close to any except ~ two.  They are half-siblings.  "I am the only child with the same mother and father.  I father had all the kids before he married my mother." Did patient suffer any verbal/emotional/physical/sexual abuse as a child?: No Did patient suffer from severe childhood neglect?: No Has patient ever been sexually abused/assaulted/raped as an adolescent or adult?: No Was the patient ever a victim of a crime or a disaster?: No Witnessed domestic violence?: No Has patient been effected by domestic  violence as an adult?: No  CCA Part Two B  Employment/Work Situation: Employment / Work Psychologist, occupational Employment situation: Employed Where is patient currently employed?: [employer] How long has patient been employed?: 11 yrs Patient's job has been impacted by current illness: Yes Describe how patient's job has been impacted: Reports poor performance along with absences Has patient ever been in the [employer]?: No Has patient ever served in combat?: No Did You Receive Any Psychiatric Treatment/Services While in Equities trader?: No Are There Guns or Other Weapons in Your Home?: No Are These Comptroller?:  (n/a)  Education: Education Did Garment/textile technologist From [employer]?: Yes Did Theme park manager?: Yes What Was Your Major?: Currently taking online classes Did You Have An Individualized Education Program (IIEP): No Did You Have Any Difficulty At [employer]?: No  Religion: Religion/Spirituality Are You A Religious Person?: Yes What is Your Religious Affiliation?: Christian  Leisure/Recreation: Leisure / Recreation Leisure and Hobbies: spending time with kids  and husband.  Exercise/Diet: Exercise/Diet Do You Exercise?: Yes What Type of Exercise Do You Do?: Run/Walk How Many Times a Week Do You Exercise?: 1-3 times a week Have You Gained or Lost A Significant Amount of Weight in the Past Six Months?: No Do You Follow a Special Diet?: No Do You Have Any Trouble Sleeping?: Yes Explanation of Sleeping Difficulties: Inability to fall and stay asleep  CCA Part Two C  Alcohol/Drug Use: Alcohol / Drug Use History of alcohol / drug use?: No history of alcohol / drug abuse                      CCA Part Three  ASAM's:  Six Dimensions of Multidimensional Assessment  Dimension 1:  Acute Intoxication and/or Withdrawal Potential:     Dimension 2:  Biomedical Conditions and Complications:     Dimension 3:  Emotional, Behavioral, or Cognitive Conditions and Complications:      Dimension 4:  Readiness to Change:     Dimension 5:  Relapse, Continued use, or Continued Problem Potential:     Dimension 6:  Recovery/Living Environment:      Substance use Disorder (SUD)    Social Function:  Social Functioning Social Maturity: Responsible Social Judgement: Normal  Stress:  Stress Stressors: Illness, Arts administrator, Work Coping Ability: Overwhelmed Patient Takes Medications The Way The Doctor Instructed?: Yes Priority Risk: Moderate Risk  Risk Assessment- Self-Harm Potential: Risk Assessment For Self-Harm Potential Thoughts of Self-Harm: No current thoughts Method: No plan Availability of Means: No access/NA  Risk Assessment -Dangerous to Others Potential: Risk Assessment For Dangerous to Others Potential Method: No Plan Availability of Means: No access or NA Intent: Vague intent or NA Notification Required: No need or identified person  DSM5 Diagnoses: Patient Active Problem List   Diagnosis Date Noted  . Moderate major depression, single episode (HCC) 11/15/2016    Class: Acute  . Low back pain 12/02/2014  . Chronic migraine without aura or status migrainosus 09/29/2012    Patient Centered Plan: Patient is on the following Treatment Plan(s):  Anxiety and Depression  Recommendations for Services/Supports/Treatments: Recommendations for Services/Supports/Treatments Recommendations For Services/Supports/Treatments: IOP (Intensive Outpatient Program)  Treatment Plan Summary:  Oriented pt to MH-IOP.  Provided pt with orientation folder.  Encouraged support groups.  Will refer pt to a therapist and psychiatrist.  Referral to Vocational Rehab.  Referrals to Alternative Service(s): Referred to Alternative Service(s):   Place:   Date:   Time:    Referred to Alternative Service(s):   Place:   Date:   Time:    Referred to Alternative Service(s):   Place:   Date:   Time:    Referred to Alternative Service(s):   Place:   Date:   Time:     Ariella Voit, RITA, M.Ed,  CNA

## 2016-11-16 ENCOUNTER — Other Ambulatory Visit (HOSPITAL_COMMUNITY): Payer: PRIVATE HEALTH INSURANCE | Admitting: Psychiatry

## 2016-11-16 DIAGNOSIS — F321 Major depressive disorder, single episode, moderate: Secondary | ICD-10-CM

## 2016-11-17 ENCOUNTER — Other Ambulatory Visit (HOSPITAL_COMMUNITY): Payer: PRIVATE HEALTH INSURANCE | Admitting: Psychiatry

## 2016-11-17 DIAGNOSIS — F321 Major depressive disorder, single episode, moderate: Secondary | ICD-10-CM

## 2016-11-17 NOTE — Progress Notes (Signed)
    Daily Group Progress Note  Program: IOP  Group Time: 9:00-12:00   Participation Level: Active   Behavioral Response: Appropriate   Type of Therapy:  Group Therapy   Summary of Progress: Pt presented as engaged.  Pt participated in goals/barriers activity.  Pt said she is trying to reduce her worry and not be so afraid of her job.  Pt said her husband tells her to "man up" and she grew up with her parents also not expressing much emotion or showing struggle in hard times.  Pt listened to and engaged in discussion with Chaplain about grief and loss.  Shaune PollackBrown, Railynn Ballo B, LPC

## 2016-11-17 NOTE — Progress Notes (Signed)
    Daily Group Progress Note  Program: IOP  Group Time: 9:00-12:00  Participation Level: Active  Behavioral Response: Appropriate  Type of Therapy:  Group Therapy  Summary of Progress: Pt. Presented as engaged in the group process, talkative, offered good support and helpful feedback to other group members. Pt. Shared that she frequently feels taken advantage of by other family members, wants to become better at establishing boundaries. Pt. Engaged in self-image discussion lead by counselor and in wellness discussion facilitated by David StallJamie Athas of the wellness department.    Shaune PollackBrown, Jennifer B, LPC

## 2016-11-17 NOTE — Progress Notes (Signed)
    Daily Group Progress Note  Program: IOP  Group Time: 9:00-12:00   Participation Level: Active   Behavioral Response: Appropriate   Type of Therapy:  Group Therapy   Summary of Progress: Pt presented as engaged and quiet.  Pt heard and participated in medication management presentation from pharmacist.  Pt received IOP orientation with case manager and psychiatrist.  Counselors explained group process to pt.  Counselor led a brief guided meditation.  Shaune PollackBrown, Onda Kattner B, LPC

## 2016-11-18 ENCOUNTER — Other Ambulatory Visit (HOSPITAL_COMMUNITY): Payer: PRIVATE HEALTH INSURANCE | Admitting: Psychiatry

## 2016-11-18 DIAGNOSIS — F321 Major depressive disorder, single episode, moderate: Secondary | ICD-10-CM

## 2016-11-19 ENCOUNTER — Other Ambulatory Visit (HOSPITAL_COMMUNITY): Payer: PRIVATE HEALTH INSURANCE

## 2016-11-19 NOTE — Progress Notes (Signed)
    Daily Group Progress Note  Program: IOP  Group Time: 9:00-12:00  Participation Level: Active  Behavioral Response: Appropriate  Type of Therapy:  Group Therapy  Summary of Progress: Pt. Presented as talkative, engaged in the group process, smiled appropriately. Pt. Participated in discussion about development of self-love, positive body image, use of positive affirmation and self-talk to develop positive self-concept. Pt. Participated in discussion about the use of meditation for emotion regulation and management of overwhelming thoughts.      Brown, Jennifer B, LPC 

## 2016-11-22 ENCOUNTER — Other Ambulatory Visit (HOSPITAL_COMMUNITY): Payer: PRIVATE HEALTH INSURANCE | Admitting: Psychiatry

## 2016-11-22 DIAGNOSIS — F321 Major depressive disorder, single episode, moderate: Secondary | ICD-10-CM | POA: Diagnosis not present

## 2016-11-23 ENCOUNTER — Other Ambulatory Visit (HOSPITAL_COMMUNITY): Payer: PRIVATE HEALTH INSURANCE | Admitting: Psychiatry

## 2016-11-23 DIAGNOSIS — F321 Major depressive disorder, single episode, moderate: Secondary | ICD-10-CM | POA: Diagnosis not present

## 2016-11-23 NOTE — Progress Notes (Signed)
    Daily Group Progress Note  Program: IOP  Group Time: 9:00-12:00   Participation Level: Active   Behavioral Response: Appropriate   Type of Therapy:  Group Therapy   Summary of Progress: Pt presented as engaged.  Pt listened to and participated in discussion of medications with the pharmacist.  Pt said that her migraines still affect her every other day on average and she wonders if some of her anxiety symptoms are linked to oncoming migraines.  Counselor and pt discussed possibility of Botox treatment for migraines.  Pt said she practiced self-care over the weekend by meeting up with her childhood best friend in [place].  Pt was very supportive and affirming of other group members.  Shaune PollackBrown, Jennifer B, LPC

## 2016-11-24 ENCOUNTER — Other Ambulatory Visit (HOSPITAL_COMMUNITY): Payer: PRIVATE HEALTH INSURANCE

## 2016-11-24 NOTE — Progress Notes (Signed)
    Daily Group Progress Note  Program: IOP  Group Time: 9:00-12:00   Participation Level: Active   Behavioral Response: Appropriate   Type of Therapy:  Group Therapy   Summary of Progress: Pt presented as engaged.  Pt said that she has been thinking about her father lately, as he was recently diagnosed with scleroderma.  Pt related to other group members in having difficulties at work.  Pt participated in developing unintentional cognitive model, identifying her dissatisfaction at work as the circumstance.  Pt feels stuck, frustrated and guilty about this and is trying to figure out what to do, since her husband is not working right now.  Shaune PollackBrown, Jennifer B, LPC

## 2016-11-25 ENCOUNTER — Other Ambulatory Visit (HOSPITAL_COMMUNITY): Payer: PRIVATE HEALTH INSURANCE

## 2016-11-25 ENCOUNTER — Ambulatory Visit (HOSPITAL_COMMUNITY): Payer: Self-pay | Admitting: Psychiatry

## 2016-11-26 ENCOUNTER — Other Ambulatory Visit (HOSPITAL_COMMUNITY): Payer: PRIVATE HEALTH INSURANCE | Attending: Psychiatry | Admitting: Psychiatry

## 2016-11-26 DIAGNOSIS — F321 Major depressive disorder, single episode, moderate: Secondary | ICD-10-CM

## 2016-11-26 DIAGNOSIS — F331 Major depressive disorder, recurrent, moderate: Secondary | ICD-10-CM | POA: Insufficient documentation

## 2016-11-29 ENCOUNTER — Other Ambulatory Visit (HOSPITAL_COMMUNITY): Payer: PRIVATE HEALTH INSURANCE | Admitting: Psychiatry

## 2016-11-29 DIAGNOSIS — F331 Major depressive disorder, recurrent, moderate: Secondary | ICD-10-CM | POA: Diagnosis not present

## 2016-11-29 DIAGNOSIS — F321 Major depressive disorder, single episode, moderate: Secondary | ICD-10-CM

## 2016-11-30 ENCOUNTER — Other Ambulatory Visit (HOSPITAL_COMMUNITY): Payer: PRIVATE HEALTH INSURANCE | Admitting: Psychiatry

## 2016-11-30 DIAGNOSIS — F321 Major depressive disorder, single episode, moderate: Secondary | ICD-10-CM

## 2016-11-30 DIAGNOSIS — F331 Major depressive disorder, recurrent, moderate: Secondary | ICD-10-CM | POA: Diagnosis not present

## 2016-11-30 NOTE — Progress Notes (Signed)
    Daily Group Progress Note  Program: IOP  Summary of Progress: Group Time: 9:00-12:00   Participation Level: Active   Behavioral Response: Appropriate   Type of Therapy:  Group Therapy   Summary of Progress: Pt presented as engaged.  Pt participated in drawing check-in activity.  Pt said she is sad because she is still dealing with a migraine, which is painful.  Pt said she tries not to cry in front of her children and she has always tried to be the "strong one", like her mother.  Counselors and other group members challenged pt to consider that being strong may not necessarily mean not crying.  Pt participated in cognitive modeling activity, identifying her circumstance as the fact that she is dissatisfied with her job.  Her intentional model starts with the thought that "I do not currently have a plan to leave my job, but I can leave when I do".  The associated feelings are hope and contentment and the outcome is better mood and outlook.       Shaune PollackBrown, Maryclaire Stoecker B, LPC

## 2016-12-01 ENCOUNTER — Other Ambulatory Visit (HOSPITAL_COMMUNITY): Payer: PRIVATE HEALTH INSURANCE | Admitting: Psychiatry

## 2016-12-01 DIAGNOSIS — F321 Major depressive disorder, single episode, moderate: Secondary | ICD-10-CM

## 2016-12-01 DIAGNOSIS — F331 Major depressive disorder, recurrent, moderate: Secondary | ICD-10-CM | POA: Diagnosis not present

## 2016-12-01 NOTE — Progress Notes (Signed)
    Daily Group Progress Note  Program: IOP    Group Time: 9:00-12:00   Participation Level: Active   Behavioral Response: Appropriate   Type of Therapy:  Group Therapy   Summary of Progress: Pt presented as engaged.  Pt said that she was getting over sickness and still had a migraine, which was disturbing her sleep.  Pt said she also has trouble remembering to eat on a normal schedule.  Counselor provided brief psychoeducation around intuitive eating.  Pt participated in psychoeducation about physical, spiritual, emotional and mental health.  Shaune PollackBrown, Jennifer B, LPC

## 2016-12-01 NOTE — Progress Notes (Signed)
    Daily Group Progress Note  Program: IOP Group Time: 9:00-12:00   Participation Level: Active   Behavioral Response: Appropriate   Type of Therapy:  Group Therapy   Summary of Progress: Pt presented as engaged and quiet.  Pt said she is still sick with upper respiratory infection and suffering with a migraine.  Pt provided supportive feedback to other group members about setting healthy boundaries with other family members and the importance of receiving support from others.     Shaune PollackBrown, Zera Markwardt B, LPC

## 2016-12-02 ENCOUNTER — Other Ambulatory Visit (HOSPITAL_COMMUNITY): Payer: PRIVATE HEALTH INSURANCE | Admitting: Psychiatry

## 2016-12-02 DIAGNOSIS — F321 Major depressive disorder, single episode, moderate: Secondary | ICD-10-CM

## 2016-12-02 DIAGNOSIS — F331 Major depressive disorder, recurrent, moderate: Secondary | ICD-10-CM | POA: Diagnosis not present

## 2016-12-02 NOTE — Progress Notes (Signed)
    Daily Group Progress Note  Program: IOP  Group Time: 9:00-12:00   Participation Level: Active   Behavioral Response: Appropriate   Type of Therapy:  Group Therapy   Summary of Progress: Pt presented as engaged and quiet.  Pt participated in self-care discussion with Chaplain.  Pt had a painful migraine; counselor offered that pt could go home but pt said she wanted to stay to get as much out of the experience as possible.  Pt started tearing up and said "It is not okay to cry, it is not okay for me to be weak".  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  Shaune PollackBrown, Jennifer B, LPC

## 2016-12-02 NOTE — Progress Notes (Deleted)
    Daily Group Progress Note  Program: IOP  Group Time: 9:00-12:00   Participation Level: Active   Behavioral Response: Appropriate   Type of Therapy:  Group Therapy   Summary of Progress: Pt presented as engaged and quiet.  Pt participated in self-care discussion with Chaplain.  Pt had a painful migraine; counselor offered that pt could go home but pt said she wanted to stay to get as much out of the experience as possible.  Pt started tearing up and said "It is not okay to cry, it is not okay for me to be weak".  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  Minha Fulco B, LPC 

## 2016-12-03 ENCOUNTER — Other Ambulatory Visit (HOSPITAL_COMMUNITY): Payer: PRIVATE HEALTH INSURANCE

## 2016-12-03 NOTE — Progress Notes (Signed)
    Daily Group Progress Note  Program: IOP  Group Time: 9:00-12:00  Participation Level: Active  Behavioral Response: Appropriate  Type of Therapy:  Group Therapy  Summary of Progress: Pt. Presented as engaged in the group process, quiet, but listening. Pt. Continued to complain of a migraine and respiratory infection. Pt. Discussed the importance of ongoing career planning, identifying her fears of moving forward in her career, and having an identified transition plan. Pt. Participated in discussion facilitated by the mental health association about the importance of community social supports.       Shaune PollackBrown, Latorya Bautch B, LPC

## 2016-12-06 ENCOUNTER — Other Ambulatory Visit (HOSPITAL_COMMUNITY): Payer: PRIVATE HEALTH INSURANCE | Admitting: Psychiatry

## 2016-12-07 ENCOUNTER — Other Ambulatory Visit (HOSPITAL_COMMUNITY): Payer: PRIVATE HEALTH INSURANCE

## 2016-12-07 ENCOUNTER — Ambulatory Visit (INDEPENDENT_AMBULATORY_CARE_PROVIDER_SITE_OTHER): Payer: PRIVATE HEALTH INSURANCE | Admitting: Psychology

## 2016-12-07 ENCOUNTER — Encounter (HOSPITAL_COMMUNITY): Payer: Self-pay | Admitting: Psychology

## 2016-12-07 DIAGNOSIS — F321 Major depressive disorder, single episode, moderate: Secondary | ICD-10-CM | POA: Diagnosis not present

## 2016-12-07 NOTE — Progress Notes (Signed)
   THERAPIST PROGRESS NOTE  Session Time: 10am-10.50am  Participation Level: Active  Behavioral Response: Well GroomedAlertstressed  Type of Therapy: Individual Therapy  Treatment Goals addressed: Diagnosis: MDD and goal 1.  Interventions: CBT and Supportive  Summary: Abigail Henry is a 36 y.o. female who presents with affect congruent w/ report of not feeling well- potential bronchitis.  Pt has been in IOP but hasn't been past couple of days due to illness- but had this appointment set for establishing outpatient counseling as part of transition from IOP. Pt reported she is gaining coping skills and awareness from IOP that needs to not avoid her emotions and practice self care before she can be caretaker for others.  Pt reports that it is difficult for her to express her emotions and not be "strong".  Pt aware that this isn't how she approaches others and working to be able to express self more to supports.  Pt discussed stressors of her migraines, job stress, dad's illness, husband's job loss, financial strain on her mental health.  Pt discussed that she is working on expectations for self and not seeing self as failed- because hasn't met goals she "should have" by now.  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  Pt w/ current financial strain has taking a break for past 2 months and hopes to be able to restart.  Pt discussed her goals for counseling.   Suicidal/Homicidal: Nowithout intent/plan  Therapist Response: assessed pt current functioning per pt report.  Focused on building rapport and exploring w/ pt connection of stressors, mental health and migraines.  Assisted pt in identifying goals for counseling and steps she is taking towards job change.  Plan: Return again in 1 weeks after completing IOP.  Pt to attend psychiatrist evaluation w/ Dr. Adele Schilder as scheduled.   Diagnosis: MDD, single,  moderate    Zarek Relph, LPC 12/07/2016

## 2016-12-08 ENCOUNTER — Encounter (HOSPITAL_COMMUNITY): Payer: Self-pay | Admitting: Psychiatry

## 2016-12-08 ENCOUNTER — Other Ambulatory Visit (HOSPITAL_COMMUNITY): Payer: PRIVATE HEALTH INSURANCE

## 2016-12-08 ENCOUNTER — Ambulatory Visit (HOSPITAL_COMMUNITY): Payer: PRIVATE HEALTH INSURANCE | Admitting: Psychiatry

## 2016-12-08 VITALS — BP 120/68 | HR 89 | Ht 62.0 in | Wt 174.8 lb

## 2016-12-08 DIAGNOSIS — G4700 Insomnia, unspecified: Secondary | ICD-10-CM | POA: Diagnosis not present

## 2016-12-08 DIAGNOSIS — Z818 Family history of other mental and behavioral disorders: Secondary | ICD-10-CM

## 2016-12-08 DIAGNOSIS — Z811 Family history of alcohol abuse and dependence: Secondary | ICD-10-CM | POA: Diagnosis not present

## 2016-12-08 DIAGNOSIS — Z6379 Other stressful life events affecting family and household: Secondary | ICD-10-CM | POA: Diagnosis not present

## 2016-12-08 DIAGNOSIS — R4582 Worries: Secondary | ICD-10-CM | POA: Diagnosis not present

## 2016-12-08 DIAGNOSIS — R5383 Other fatigue: Secondary | ICD-10-CM

## 2016-12-08 DIAGNOSIS — F1010 Alcohol abuse, uncomplicated: Secondary | ICD-10-CM

## 2016-12-08 DIAGNOSIS — F331 Major depressive disorder, recurrent, moderate: Secondary | ICD-10-CM | POA: Diagnosis not present

## 2016-12-08 DIAGNOSIS — R4584 Anhedonia: Secondary | ICD-10-CM | POA: Diagnosis not present

## 2016-12-08 DIAGNOSIS — R454 Irritability and anger: Secondary | ICD-10-CM | POA: Diagnosis not present

## 2016-12-08 DIAGNOSIS — Z563 Stressful work schedule: Secondary | ICD-10-CM | POA: Diagnosis not present

## 2016-12-08 MED ORDER — FLUOXETINE HCL 10 MG PO CAPS: ORAL_CAPSULE | ORAL | 1 refills | Status: DC

## 2016-12-08 NOTE — Progress Notes (Signed)
Psychiatric Initial Adult Assessment   Patient Identification: Abigail FinesQuiana Abigail Henry MRN:  [id number] Date of Evaluation:  12/08/2016 Referral Source: Intensive outpatient program Chief Complaint:  I am very depressed. Visit Diagnosis:    ICD-10-CM   1. MDD (major depressive disorder), recurrent episode, moderate (HCC) F33.1 FLUoxetine (PROZAC) 10 MG capsule    History of Present Illness: Patient is a 36 year old African-American, married, employed female who is referred from intensive outpatient program for the management of depression.  Patient endorses history of depression which has been getting worse in recent months.  Her major stressors are the stressful working environment, husband recently lost his job, father's health is deteriorating not getting better with persistent migraine headaches.  She endorsed poor sleep, anhedonia, lack of energy, poor attention, poor concentration and noticed more irritable.  She feels that all these things are sucked her and she has no motivation to do things.  She admitted excessive worries and racing thoughts.  She has been trying to avoid others and wanted to be isolated.  Her job is recently changed.  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  She gets easily overwhelmed.  Her husband lost her job in September and trying to find a new job.  Patient has 751-year-old daughter and 35-year-old son.  Her parents live 2 hours away and she is very concerned about her father's health who was diagnosed with Raynaud disease.  Patient was referred to intensive outpatient program from her friend and she completed the program.  She did not prescribed antidepressant but she feels that she may need it.  She has been taking nortriptyline 25 mg for migraine headache for more than a year but she does not see any improvement.  She continues to have migraine attacks and she is [facility].  She is  also taking Inderal and Zomig.  She is hoping to get Botox injection on her next appointment.  Patient admitted social drinking and smoking marijuana when she had intense migraine attacks.  However she denies binge drinking and denies any tremors, shakes or any withdrawal symptoms.  Patient denies any nightmares, paranoia, hallucination, suicidal thoughts or homicidal thoughts.  Patient denies any mania, psychosis, PTSD or any OCD symptoms.  Her appetite is okay.  Her energy level is fair.  Associated Signs/Symptoms: Depression Symptoms:  depressed mood, anhedonia, insomnia, fatigue, difficulty concentrating, hopelessness, panic attacks, loss of energy/fatigue, disturbed sleep, weight gain, (Hypo) Manic Symptoms:  Irritable Mood, Anxiety Symptoms:  Excessive Worry, Psychotic Symptoms:  no psychotic symptoms PTSD Symptoms: Negative  Past Psychiatric History: Patient denies any history of psychiatric inpatient treatment or suicidal attempt.  She had never seen a psychiatrist before.  She is taking nortriptyline to prevent migraine headaches but it is not helping her.  Previous Psychotropic Medications: No   Substance Abuse History in the last 12 months:  No.  Consequences of Substance Abuse: Negative  Past Medical History:  Past Medical History:  Diagnosis Date  . Back pain   . Depression   . HA (headache)     Past Surgical History:  Procedure Laterality Date  . None      Family Psychiatric History: The patient denies any family history of mental illness.  Family History:  Family History  Problem Relation Age of Onset  . High blood pressure Mother   . Healthy Father   . Raynaud syndrome Father        in  hands  . Alcohol abuse Maternal Uncle     Social History:   Social History   Socioeconomic History  . Marital status: Married    Spouse name: Jeannett SeniorStephen  . Number of children: 2  . Years of education: 8312  . Highest education level: None  Social Needs  .  Financial resource strain: None  . Food insecurity - worry: None  . Food insecurity - inability: None  . Transportation needs - medical: None  . Transportation needs - non-medical: None  Occupational History    Employer: [employer]  Tobacco Use  . Smoking status: Never Smoker  . Smokeless tobacco: Never Used  Substance and Sexual Activity  . Alcohol use: Yes    Alcohol/week: 0.6 oz    Types: 1 Cans of beer per week    Comment: 2 - 3 glasses on the weekend  . Drug use: No    Comment: 05/18/16 Marijuana occasionally for migraines  . Sexual activity: Yes  Other Topics Concern  . None  Social History Narrative   Patient lives at home with her husband [name](Stephen) and two children. Patient works full time. [employer] .     Caffeine- two cups    Right handed.    Additional Social History: Patient [place]  She has been married for 8 years.  Her parents live at [place].  Patient denies any history of physical sexual or verbal abuse.  She lives with her husband and 2 kids.  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  Allergies:   Allergies  Allergen Reactions  . Zonegran [Zonisamide] Other (See Comments)    hallucinating    Metabolic Disorder Labs: No results found for: HGBA1C, MPG No results found for: PROLACTIN No results found for: CHOL, TRIG, HDL, CHOLHDL, VLDL, LDLCALC   Current Medications: Current Outpatient Medications  Medication Sig Dispense Refill  . amoxicillin (AMOXIL) 875 MG tablet 875 mg.    . benzonatate (TESSALON) 200 MG capsule 200 capsules.    . diphenhydrAMINE (BENADRYL) 25 MG tablet Take 25 mg by mouth every 6 (six) hours as needed.    Marland Kitchen. FLUoxetine (PROZAC) 10 MG capsule Take 1 capsule daily for 1 week and than 2 capsule daily 60 capsule 1  . loratadine (CLARITIN) 10 MG tablet Take 10 mg by mouth as needed for allergies.    . promethazine (PHENERGAN) 12.5 MG tablet Take 12.5 mg by mouth daily.    . propranolol ER  (INDERAL LA) 80 MG 24 hr capsule Take 1 capsule (80 mg total) by mouth daily. 90 capsule 1  . zolmitriptan (ZOMIG) 5 MG tablet Take 1 tablet (5 mg total) by mouth as needed for migraine. 12 tablet 11   No current facility-administered medications for this visit.     Neurologic: Headache: Yes Seizure: No Paresthesias:No  Musculoskeletal: Strength & Muscle Tone: within normal limits Gait & Station: normal Patient leans: N/A  Psychiatric Specialty Exam: ROS  Blood pressure 120/68, pulse 89, height 5\' 2"  (1.575 m), weight 174 lb 12.8 oz (79.3 kg).There is no height or weight on file to calculate BMI.  General Appearance: Casual  Eye Contact:  Fair  Speech:  Slow  Volume:  Decreased  Mood:  Depressed and Dysphoric  Affect:  Congruent and Constricted  Thought Process:  Goal Directed  Orientation:  Full (Time, Place, and Person)  Thought Content:  Logical and Rumination  Suicidal Thoughts:  No  Homicidal Thoughts:  No  Memory:  Immediate;   Good Recent;   Good Remote;   Good  Judgement:  Good  Insight:  Good  Psychomotor Activity:  Decreased  Concentration:  Concentration: Fair and Attention Span: Fair  Recall:  Good  Fund of Knowledge:Good  Language: Good  Akathisia:  No  Handed:  Right  AIMS (if indicated):  0  Assets:  Communication Skills Desire for Improvement Housing Resilience Social Support  ADL's:  Intact  Cognition: WNL  Sleep: Fair   Assessment: Major depressive disorder, recurrent mild.  Plan: I review her symptoms, history, current medication and collateral information.  She is taking nortriptyline 25 mg for migraine headache but it is not helping her depression and migraine.  Recommended to discontinue nortriptyline and try Prozac 10 mg daily for 1 week and then 20 mg daily.  She is hoping to get Botox injection on her next appointment with neurologist.  She started seeing individual therapy with Forde Radon.  Discussed medication side effects and  benefits.  Recommended to call us back if she has any question or any concern.  Discussed safety concerns at any time having active suicidal thoughts or homicidal thoughts and she need to call 911 or go to local emergency room.  Follow-up in 4-6 weeks.  Laporsche Hoeger T., MD 11/14/20187:40 AM

## 2016-12-09 ENCOUNTER — Other Ambulatory Visit (HOSPITAL_COMMUNITY): Payer: PRIVATE HEALTH INSURANCE | Admitting: Psychiatry

## 2016-12-09 DIAGNOSIS — F331 Major depressive disorder, recurrent, moderate: Secondary | ICD-10-CM

## 2016-12-09 NOTE — Progress Notes (Signed)
    Daily Group Progress Note  Program: IOP  Group Time: 9:00-12:00  Participation Level: Active  Behavioral Response: Appropriate  Type of Therapy:  Group Therapy  Summary of Progress: Pt. Presented with calm, bright affect, engaged in the group process. Pt. Listened and provided thoughtful feedback to group member who was experiencing a work problem and shared that it was similar to her own work related problems. Pt. Participated in grief and loss session with the chaplain. Pt. Participated in yoga therapy with Forde RadonLeanne Yates, LPC.     Shaune PollackBrown, Jennifer B, LPC

## 2016-12-10 ENCOUNTER — Other Ambulatory Visit (HOSPITAL_COMMUNITY): Payer: PRIVATE HEALTH INSURANCE

## 2016-12-13 ENCOUNTER — Other Ambulatory Visit (HOSPITAL_COMMUNITY): Payer: PRIVATE HEALTH INSURANCE | Admitting: Psychiatry

## 2016-12-13 DIAGNOSIS — F331 Major depressive disorder, recurrent, moderate: Secondary | ICD-10-CM | POA: Diagnosis not present

## 2016-12-14 ENCOUNTER — Other Ambulatory Visit (HOSPITAL_COMMUNITY): Payer: PRIVATE HEALTH INSURANCE | Admitting: Psychiatry

## 2016-12-14 DIAGNOSIS — F331 Major depressive disorder, recurrent, moderate: Secondary | ICD-10-CM | POA: Diagnosis not present

## 2016-12-14 NOTE — Patient Instructions (Signed)
D:  Patient successfully completed MH-IOP today.  A:  Discharge today.  Follow up with Forde RadonLeAnne Yates, LPC on 12-21-16 @ 1:30 pm and Dr. Lolly MustacheArfeen on 02-01-17 @ 11 a.m.  Encouraged support groups.  Return to work on 12-27-16; without any restrictions.  R:  Pt receptive.

## 2016-12-14 NOTE — Progress Notes (Signed)
BH IOP DISCHARGE NOTE  Patient:  Abigail Henry DOB:  06/21/1980  Date of Admission: 11/15/2016  Date of Discharge: 12/14/2016  Reason for Admission:depression  IOP Course:attended and participated.  Says the coping skills training were very helpful.  Still depressed but not as severely.  Pretty sure she will need to change jobs but will return to work till she can find something else.  Mental Status at Discharge:no suicidal thinking  Diagnosis: major depression recurrent moderate  Level of Care:  IOP  Discharge destination:  Has appointments with a psychiatrist and therapist   Comments:  none  The patient received suicide prevention pamphlet:  Burnice LoganYes   CLARK, RITA Patient ID: Abigail Henry, female   DOB: 09/15/1980, 36 y.o.   MRN: [id number]

## 2016-12-14 NOTE — Progress Notes (Signed)
Abigail Henry is a 36 y.o. ,married, employed, [place], who was a self referral; treatment for worsening depressive symptoms.  Denies SI/HI or A/V hallucinations.  According to pt, her symptoms worsened since September 2018.  Triggers/Stressors:  1) Financial Strain:  In September 2018; husband's job closed Engineer, structural([employer]).  He had been there 1 1/2 yrs and enjoyed working there.  Currently looking for a job, but has had no luck.  2)  Father's illness:  He has Raynaud's Disease.  He is in pain a lot.  "He has always been the strong parent and it is very hard to see him going through this."  Parent's reside in [place], [place].  3)  Job of eleven yrs.  Hasn't been meeting quotas.  "I have to sale now and it's not in my nature to take advantage of customers."  4)  Health Issues:  Migraines.  Pt states she doesn't know the triggers, but is having them frequently.  According to pt, the migraines have triggered her back to be in pain.  "Now I have to see a chiropractor."  Pt denies any prior psychiatric hospitalizations or suicide attempts/gestures.  Denies ever seeing a psychiatrist or therapist before.  Family hx:  Maternal Uncle:  ETOH. Pt completed MH-IOP today.  Reports program has been very helpful.  "I have been able to even assist my mother with some coping skills that I've learned."  Admit depression screening pt scored 20; upon discharge she scored 27.  States she is very anxious about returning to work on 12-27-16.  Pt plans to look at other options.  A:  D/C today.  F/U with Dr. Lolly MustacheArfeen 02-01-17 and For[facility]onLeAnne Yates, [facility] on 12-21-16.  Encouraged pt to contact [facility] re: job options.  R:  RTW on 12-27-16; without any restrictions.  Pt receptive.                                                                                                      Chestine SporeLARK, RITA, M.Ed, CNA

## 2016-12-15 ENCOUNTER — Other Ambulatory Visit (HOSPITAL_COMMUNITY): Payer: PRIVATE HEALTH INSURANCE

## 2016-12-15 NOTE — Progress Notes (Signed)
    Daily Group Progress Note  Program: IOP  Group Time: 9:00-12:00   Participation Level: Active   Behavioral Response: Appropriate   Type of Therapy:  Group Therapy   Summary of Progress: Pt presented as engaged.  Pt expressed that she has a difficult time asserting her needs and forming healthy boundaries with others.  Counselors engaged pt in discussion of when it is appropriate to say "no".  Pt expressed gratitude for her time in IOP.  Counselors and other group members expressed appreciation and affirmation for pt's time in group.  Pt said she has been teaching her mom the things that she has learned in group.  Shaune PollackBrown, Rilynne Lonsway B, LPC

## 2016-12-15 NOTE — Progress Notes (Signed)
    Daily Group Progress Note  Program: IOP  Group Time: 9:00-12:00   Participation Level: Active   Behavioral Response: Appropriate   Type of Therapy:  Group Therapy   Summary of Progress: Pt presented as engaged and quiet.  Pt said that it was this time of year a few years ago that her grandmother started going really downhill with her Alzheimer's before she passed away in December 2015.  Pt participated in values and purpose activity.  Pt said that her family is her passion and that she tries to listen to and help others as much as possible.  Pt said that group has been helpful to her; pt plans to discharge tomorrow.  Shaune PollackBrown, Jennifer B, LPC

## 2016-12-17 ENCOUNTER — Other Ambulatory Visit (HOSPITAL_COMMUNITY): Payer: PRIVATE HEALTH INSURANCE

## 2016-12-20 ENCOUNTER — Other Ambulatory Visit (HOSPITAL_COMMUNITY): Payer: PRIVATE HEALTH INSURANCE

## 2016-12-21 ENCOUNTER — Other Ambulatory Visit (HOSPITAL_COMMUNITY): Payer: PRIVATE HEALTH INSURANCE

## 2016-12-21 ENCOUNTER — Emergency Department (HOSPITAL_COMMUNITY): Payer: 59

## 2016-12-21 ENCOUNTER — Ambulatory Visit (INDEPENDENT_AMBULATORY_CARE_PROVIDER_SITE_OTHER): Payer: PRIVATE HEALTH INSURANCE | Admitting: Psychology

## 2016-12-21 ENCOUNTER — Emergency Department (HOSPITAL_COMMUNITY)
Admission: EM | Admit: 2016-12-21 | Discharge: 2016-12-21 | Disposition: A | Discharge status: Home / Self Care | Payer: 59 | Attending: Emergency Medicine | Admitting: Emergency Medicine

## 2016-12-21 ENCOUNTER — Other Ambulatory Visit: Payer: Self-pay

## 2016-12-21 ENCOUNTER — Encounter (HOSPITAL_COMMUNITY): Payer: Self-pay

## 2016-12-21 DIAGNOSIS — F329 Major depressive disorder, single episode, unspecified: Secondary | ICD-10-CM | POA: Diagnosis not present

## 2016-12-21 DIAGNOSIS — Z79899 Other long term (current) drug therapy: Secondary | ICD-10-CM | POA: Diagnosis not present

## 2016-12-21 DIAGNOSIS — F321 Major depressive disorder, single episode, moderate: Secondary | ICD-10-CM

## 2016-12-21 DIAGNOSIS — J40 Bronchitis, not specified as acute or chronic: Secondary | ICD-10-CM

## 2016-12-21 DIAGNOSIS — R05 Cough: Secondary | ICD-10-CM | POA: Diagnosis present

## 2016-12-21 DIAGNOSIS — J209 Acute bronchitis, unspecified: Secondary | ICD-10-CM | POA: Diagnosis not present

## 2016-12-21 MED ORDER — AZITHROMYCIN 250 MG PO TABS: 250.0000 mg | ORAL_TABLET | Freq: Every day | ORAL | 0 refills | Status: DC

## 2016-12-21 NOTE — Discharge Instructions (Signed)
Return if any problems.

## 2016-12-21 NOTE — ED Provider Notes (Signed)
[facility] EMERGENCY [facility]PARTMENT Provider Note   CSN: [id number] Arrival date & time: 12/21/16  [id number]     History   Chief Complaint No chief complaint on file.   HPI Abigail Henry is a 36 y.o. female.  The history is provided by the patient. No language interpreter was used.  Cough  This is a new problem. The current episode started more than 1 week ago. The problem occurs constantly. The problem has been gradually worsening. The cough is productive of sputum. Pertinent negatives include no chest pain. She has tried nothing for the symptoms. She is not a smoker. Her past medical history is significant for bronchitis.   Pt recently treat with antibiotics after having bronchitis for 4 weeks.  Pt reports she was better for a few days and symptoms came back.   Past Medical History:  Diagnosis Date  . Back pain   . Depression   . HA (headache)     Patient Active Problem List   Diagnosis Date Noted  . Moderate major depression, single episode (HCC) 11/15/2016    Class: Acute  . Low back pain 12/02/2014  . Chronic migraine without aura or status migrainosus 09/29/2012    Past Surgical History:  Procedure Laterality Date  . None      OB History    No data available       Home Medications    Prior to Admission medications   Medication Sig Start Date End Date Taking? Authorizing Provider  amoxicillin (AMOXIL) 875 MG tablet 875 mg. 12/03/16   [provider]  benzonatate (TESSALON) 200 MG capsule 200 capsules. 12/03/16   [provider]  diphenhydrAMINE (BENADRYL) 25 MG tablet Take 25 mg by mouth every 6 (six) hours as needed.    [provider]  FLUoxetine (PROZAC) 10 MG capsule Take 1 capsule daily for 1 week and than 2 capsule daily 12/08/16   Arfeen, Phillips GroutSyed T, MD  loratadine (CLARITIN) 10 MG tablet Take 10 mg by mouth as needed for allergies.    [provider]  promethazine (PHENERGAN) 12.5 MG tablet Take 12.5 mg by  mouth daily. 07/19/12   [provider]  propranolol ER (INDERAL LA) 80 MG 24 hr capsule Take 1 capsule (80 mg total) by mouth daily. 08/18/16   Nilda RiggsMartin, Nancy Carolyn, NP  zolmitriptan (ZOMIG) 5 MG tablet Take 1 tablet (5 mg total) by mouth as needed for migraine. 09/21/16   Levert FeinsteinYan, Yijun, MD    Family History Family History  Problem Relation Age of Onset  . High blood pressure Mother   . Healthy Father   . Raynaud syndrome Father        in hands  . Alcohol abuse Maternal Uncle     Social History Social History   Tobacco Use  . Smoking status: Never Smoker  . Smokeless tobacco: Never Used  Substance Use Topics  . Alcohol use: Yes    Alcohol/week: 0.6 oz    Types: 1 Cans of beer per week    Comment: 2 - 3 glasses on the weekend  . Drug use: No    Comment: 05/18/16 Marijuana occasionally for migraines     Allergies   Zonegran [zonisamide]   Review of Systems Review of Systems  Respiratory: Positive for cough.   Cardiovascular: Negative for chest pain.  All other systems reviewed and are negative.    Physical Exam Updated Vital Signs BP (!) 142/94   Pulse 100   Temp 98.5 F (  36.9 C) (Oral)   Resp 18   LMP 12/03/2016   SpO2 99%   Physical Exam  Constitutional: She is oriented to person, place, and time. She appears well-developed and well-nourished.  HENT:  Head: Normocephalic.  Right Ear: External ear normal.  Left Ear: External ear normal.  Mouth/Throat: Oropharynx is clear and moist.  Eyes: EOM are normal.  Neck: Normal range of motion.  Cardiovascular: Normal rate and regular rhythm.  Pulmonary/Chest: Effort normal. She has no wheezes. She has no rales.  Abdominal: Soft. She exhibits no distension.  Musculoskeletal: Normal range of motion.  Neurological: She is alert and oriented to person, place, and time.  Psychiatric: She has a normal mood and affect.  Nursing note and vitals reviewed.    ED Treatments / Results  Labs (all labs ordered  are listed, but only abnormal results are displayed) Labs Reviewed - No data to display  EKG  EKG Interpretation None       Radiology Dg Chest 2 View  Result Date: 12/21/2016 CLINICAL DATA:  Cough. EXAM: CHEST  2 VIEW COMPARISON:  No recent prior . FINDINGS: Mediastinum and hilar structures normal. Lungs are clear. No pleural effusion or pneumothorax. Heart size normal. Thoracic spine scoliosis. No acute bony abnormality . IMPRESSION: No acute abnormality . Electronically Signed   By: Maisie Fushomas  Register   On: 12/21/2016 08:59    Procedures Procedures (including critical care time)  Medications Ordered in ED Medications - No data to display   Initial Impression / Assessment and Plan / ED Course  I have reviewed the triage vital signs and the nursing notes.  Pertinent labs & imaging results that were available during my care of the patient were reviewed by me and considered in my medical decision making (see chart for details).     Chest xray  No acute abnormality  Final Clinical Impressions(s) / ED Diagnoses   Final diagnoses:  Bronchitis    ED Discharge Orders        Ordered    azithromycin (ZITHROMAX) 250 MG tablet  Daily     12/21/16 1036    An After Visit Summary was printed and given to the patient.    Elson AreasSofia, Marquinn Meschke K, [place]-C 12/21/16 1036    Vanetta MuldersZackowski, Scott, MD 12/31/16 [phone]

## 2016-12-21 NOTE — Progress Notes (Signed)
   THERAPIST PROGRESS NOTE  Session Time: 1.30pm-2.19pm  Participation Level: Active  Behavioral Response: Well GroomedAlertAnxious  Type of Therapy: Individual Therapy  Treatment Goals addressed: Diagnosis: MDD and goal 1.  Interventions: CBT and Supportive  Summary: Abigail FinesQuiana Henry is a 36 y.o. female who presents with affect congruent w/ report of not feeling well- dx w/ bronchitis by the ED.  Pt reported that over past couple days coughing and feeling drained remerged.  Pt reported that she is feeling anxious and overwhelmed by things recent- trying to figure out path w/ job, school, getting a house.  Pt reports that she is feeling likes needs to do everything and get done now- but aware that ok that process is taking longer. Pt did discuss stressors from multiple directions being mom, daughter, the "go to" in her family.  Pt is increasing awareness of needing to be able to set boundaries and reframe to resolve guilt and unhealthy expectations.   Suicidal/Homicidal: Nowithout intent/plan  Therapist Response: Assessed pt current functionning per pt repor.t  Processed w/pt coping w/ anxiety, depression- reframing expectations and establishing boundaries that more realistic.  Plan: Return again in 2 weeks.  Diagnosis: MDD   Forde RadonYATES,LEANNE, [facility] 12/21/2016

## 2016-12-21 NOTE — ED Triage Notes (Signed)
Patient complains of 1 week of cough and congestion, alert and oriented, NAD

## 2016-12-22 ENCOUNTER — Other Ambulatory Visit (HOSPITAL_COMMUNITY): Payer: PRIVATE HEALTH INSURANCE

## 2016-12-23 ENCOUNTER — Other Ambulatory Visit (HOSPITAL_COMMUNITY): Payer: PRIVATE HEALTH INSURANCE

## 2016-12-24 ENCOUNTER — Other Ambulatory Visit (HOSPITAL_COMMUNITY): Payer: PRIVATE HEALTH INSURANCE

## 2016-12-27 ENCOUNTER — Other Ambulatory Visit (HOSPITAL_COMMUNITY): Payer: 59

## 2016-12-28 ENCOUNTER — Other Ambulatory Visit (HOSPITAL_COMMUNITY): Payer: 59

## 2016-12-29 ENCOUNTER — Other Ambulatory Visit (HOSPITAL_COMMUNITY): Payer: 59

## 2016-12-30 ENCOUNTER — Other Ambulatory Visit (HOSPITAL_COMMUNITY): Payer: 59

## 2016-12-31 ENCOUNTER — Other Ambulatory Visit (HOSPITAL_COMMUNITY): Payer: 59

## 2017-01-03 ENCOUNTER — Other Ambulatory Visit (HOSPITAL_COMMUNITY): Payer: 59

## 2017-01-04 ENCOUNTER — Ambulatory Visit (HOSPITAL_COMMUNITY): Payer: Self-pay | Admitting: Psychology

## 2017-01-04 ENCOUNTER — Other Ambulatory Visit (HOSPITAL_COMMUNITY): Payer: 59

## 2017-01-05 ENCOUNTER — Other Ambulatory Visit (HOSPITAL_COMMUNITY): Payer: 59

## 2017-01-05 ENCOUNTER — Ambulatory Visit: Payer: Self-pay | Admitting: Neurology

## 2017-01-06 ENCOUNTER — Other Ambulatory Visit (HOSPITAL_COMMUNITY): Payer: 59

## 2017-01-07 ENCOUNTER — Other Ambulatory Visit (HOSPITAL_COMMUNITY): Payer: 59

## 2017-01-11 ENCOUNTER — Telehealth (HOSPITAL_COMMUNITY): Payer: Self-pay

## 2017-01-11 ENCOUNTER — Other Ambulatory Visit (HOSPITAL_COMMUNITY): Payer: Self-pay | Admitting: Psychiatry

## 2017-01-11 ENCOUNTER — Ambulatory Visit (INDEPENDENT_AMBULATORY_CARE_PROVIDER_SITE_OTHER): Payer: PRIVATE HEALTH INSURANCE | Admitting: Psychology

## 2017-01-11 DIAGNOSIS — F321 Major depressive disorder, single episode, moderate: Secondary | ICD-10-CM

## 2017-01-11 NOTE — Telephone Encounter (Signed)
Patient is here today after her therapy appointment, she states she has been unable to go back to work, she sees you on 1/8 and would like to know if she can be out until then. She has tried to go in several times, but each time her anxiety gets the better of her and she goes home. Please review and advise, thank you

## 2017-01-11 NOTE — Progress Notes (Signed)
   THERAPIST PROGRESS NOTE  Session Time: 12.33pm-1.18pm  Participation Level: Active  Behavioral Response: Well GroomedAlertAnxious  Type of Therapy: Individual Therapy  Treatment Goals addressed: Diagnosis: MDD and goal 1.  Interventions: CBT and Supportive  Summary: Abigail FinesQuiana Henry is a 36 y.o. female who presents with affect congruent w/ report of not feeling well w/migraine she has had for 3 weeks and no releif.  Pt reported that she was supposed to have botox last week- but was cancelled last week due to snow.  Pt reported she didn't go back to work on December 4 w/ migraine and anxiety that peaked.  Pt agreed to connect w/ her neurologist if impacting that severe.  Pt reported that she is also has been thinking a lot about her grandmother who is deceased.  Pt reports has pushed back feelings so long and talking w/ husband about now which is helpful. Pt still avoidant about talking w/ other family members- not wanting to upset- but increased awareness that has helped her husband as well to talk about his grief.  Pt reports will focus on mindfulness skills to assist w/ coping.  Suicidal/Homicidal: Nowithout intent/plan  Therapist Response: Assessed pt current functioning per pt report.  Processed w/pt stressors and pt avoidance.  Encouraged use of using coping skills and not avoiding.  Also encouraged need to connect w/ neurologist w/ 3 week migraine.   Plan: Return again in 2 weeks.  Diagnosis: MDD   Forde RadonYATES,LEANNE, [facility] 01/11/2017

## 2017-01-12 ENCOUNTER — Telehealth: Payer: Self-pay | Admitting: Neurology

## 2017-01-12 NOTE — Telephone Encounter (Signed)
Felicia[facility] [phone] opt [phone] called to schedule botox delivery. Please call

## 2017-01-13 NOTE — Telephone Encounter (Signed)
Pt has called and is asking for a call back from Danielle °

## 2017-01-26 ENCOUNTER — Encounter: Payer: Self-pay | Admitting: *Deleted

## 2017-01-26 ENCOUNTER — Ambulatory Visit (INDEPENDENT_AMBULATORY_CARE_PROVIDER_SITE_OTHER): Payer: 59 | Admitting: Neurology

## 2017-01-26 ENCOUNTER — Encounter: Payer: Self-pay | Admitting: Neurology

## 2017-01-26 VITALS — BP 125/84 | HR 92 | Ht 62.0 in | Wt 169.0 lb

## 2017-01-26 DIAGNOSIS — G43709 Chronic migraine without aura, not intractable, without status migrainosus: Secondary | ICD-10-CM

## 2017-01-26 NOTE — Progress Notes (Signed)
**  Botox 100 units x 2 vials, NDC 0023-1145-01, Lot [id number], Exp 05/2019, specialty pharmacy.//mck,rn** 

## 2017-01-26 NOTE — Progress Notes (Signed)
[facility]  PATIENT: Wynetta FinesQuiana Benedicto DOB: 04/13/1980   HISTORY OF PRESENT ILLNESS:Brenleigh Hyacinth MeekerMiller is a 37 years old right-handed female, seen in refer in October 10 2014 by  her primary care physician Dr. Marcellus ScottWilliam Hancock for evaluation of low back pain, migraine headaches Patient currently describes his lower extremities in July 2 weeks and will lose her knee doing good arm were doing well  B after she gets speech is (we have the biggest things scheduled physiatry 50 every 8 8 C5 I saw there haven't had a chance really look at it is I have reviewed laboratory evaluation in March 2015, normal TSH, vitamin D level was low 9, normal being 30 and above, normal free T4, LDL was 33, cholesterol was 148, normal CBC, CMP, creatinine was 0.9 She had a history of migraine since 2012, usually starting from back of her head, spreading forward to become a pounding headaches, retro-orbital area, with associated light noise sensitivity, lasting for a few hours, she has up to 15 or more headaches days in a month  I have seen her in 2014 for similar complaints, she has tried Imitrex, cause sleepiness, not help her headaches, Maxalt, not effective, she has been taking Relpax as needed, only helped 20% of time, sometimes she take marijuana as needed basis for headaches, Trigger for her headaches are weather change, stress, exertion, sleep deprivation, She has been taking Inderal 20 mg twice a day which has helped her headaches, previously tried nortriptyline was helpful, but she run out of refills, Topamax was no help, allergy pills Zyrtec was not helpful  Since 2012, she also noticed intermittent low back pain, getting worse since January 2016, right side low back pain, radiating to her right hip, right lateral leg, getting worse with prolonged sitting, she denied persistent lower extremity sensory loss or weakness, she denies gait difficulty, she has no bowel bladder incontinence. Since  September 2016 she also has intermittent right hand paresthesia, radiating to right forearm, no weakness, no right facial involvement.  UPDATE Dec 02 2014:YYshe complains of upper back pain, denies radiating pain, no gait difficulty, EMG nerve conduction study October 31 2014 was normal. There was no evidence of peripheral neuropathy, cervical radiculopathy, or right lumbosacral radiculopathy. She still has headaches 2-5 times each week, moderate severe, with associated light noise sensitivity, she think Inderal 20 mg twice a day, nortriptyline 25 mg 2 tablets every night was helpful as preventive medications, Zomig when necessary was effective as abortive treatment.  UPDATE Feb 7th 2017: She has 3-4 headaches each week, pressure, throbbing, with light noise sensitivity, lasting for whole day. 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   ALLERGIES: Allergies  Allergen Reactions  . Zonegran [Zonisamide] Other (See Comments)    hallucinating    HOME MEDICATIONS: Outpatient Medications  Prior to Visit  Medication Sig Dispense Refill  . amoxicillin (AMOXIL) 875 MG tablet 875 mg.    . azithromycin (ZITHROMAX) 250 MG tablet Take 1 tablet (250 mg total) by mouth daily. Take first 2 tablets together, then 1 every day until finished. 6 tablet 0  . benzonatate (TESSALON) 200 MG capsule 200 capsules.    . diphenhydrAMINE (BENADRYL) 25 MG tablet Take 25 mg by mouth every 6 (six) hours as needed.    Marland Kitchen FLUoxetine (PROZAC) 10 MG capsule Take 1 capsule daily for 1 week and than 2 capsule daily 60 capsule 1  . loratadine (CLARITIN) 10 MG tablet Take 10 mg by mouth as needed for allergies.    . promethazine (PHENERGAN) 12.5 MG tablet Take 12.5 mg by mouth daily.    . propranolol ER (INDERAL LA) 80 MG 24 hr capsule Take 1 capsule (80 mg total) by mouth daily. 90 capsule 1  . zolmitriptan (ZOMIG) 5 MG tablet Take 1 tablet (5 mg total) by mouth as needed for migraine. 12 tablet 11   No facility-administered medications prior to visit.     PAST MEDICAL HISTORY: Past Medical History:  Diagnosis Date  . Back pain   . Depression   . HA (headache)     PAST SURGICAL HISTORY: Past Surgical History:  Procedure Laterality Date  . None      FAMILY HISTORY: Family History  Problem Relation Age of Onset  . High blood pressure Mother   . Healthy Father   . Raynaud syndrome Father        in hands  . Alcohol abuse Maternal Uncle     SOCIAL HISTORY: Social History   Socioeconomic History  . Marital status: Married    Spouse name: [name]  . Number of children: 2  . Years of education: 21  . Highest education level: Not on file  Social Needs  . Financial resource strain: Patient refused  . Food insecurity - worry: Patient refused  . Food insecurity - inability: Patient refused  . Transportation needs - medical: Patient refused  . Transportation needs - non-medical: Patient refused  Occupational History    Employer: [employer]  Tobacco Use  . Smoking status: Never Smoker  .  Smokeless tobacco: Never Used  Substance and Sexual Activity  . Alcohol use: Yes    Alcohol/week: 0.6 oz    Types: 1 Cans of beer per week    Comment: 2 - 3 glasses on the weekend  . Drug use: No    Comment: 05/18/16 Marijuana occasionally for migraines  . Sexual activity: Yes  Other Topics Concern  . Not on file  Social History Narrative   Patient lives at home with her husband [name]) and two children. Patient works full time. [employer] .     Caffeine- two cups    Right handed.     PHYSICAL EXAM  Vitals:   01/26/17 1340  BP: 125/84  Pulse: 92  Weight: 169 lb (76.7 kg)  Height: 5\' 2"  (1.575 m)   Body mass index is 30.91 kg/m.  Generalized: Well developed, in no acute distress  Head: normocephalic and atraumatic,. Oropharynx benign  Neck: Supple, no carotid bruits  Musculoskeletal: No deformity   Neurological examination    PHYSICAL EXAMNIATION:  Gen: NAD, conversant, well nourised, obese, well groomed                     Cardiovascular: Regular rate rhythm, no peripheral edema, warm, nontender. Eyes: Conjunctivae clear without exudates or hemorrhage Neck: Supple, no carotid bruits. Pulmonary: Clear to auscultation bilaterally   NEUROLOGICAL EXAM:  MENTAL STATUS: Speech:    Speech is normal; fluent and spontaneous with normal comprehension.  Cognition:     Orientation to time, place and person     Normal recent and remote memory     Normal Attention span and concentration     Normal Language, naming, repeating,spontaneous speech     Fund of knowledge   CRANIAL NERVES: CN II: Visual fields are full to confrontation. Fundoscopic exam is normal with sharp discs and no vascular changes. Pupils are round equal and briskly reactive to light. CN III, IV, VI: extraocular movement are normal. No ptosis. CN V: Facial sensation is intact to pinprick in all 3 divisions bilaterally. Corneal responses are intact.  CN VII: Face is symmetric with normal eye closure and  smile. CN VIII: Hearing is normal to rubbing fingers CN IX, X: Palate elevates symmetrically. Phonation is normal. CN XI: Head turning and shoulder shrug are intact CN XII: Tongue is midline with normal movements and no atrophy.  MOTOR: There is no pronator drift of out-stretched arms. Muscle bulk and tone are normal. Muscle strength is normal.  REFLEXES: Reflexes are 2+ and symmetric at the biceps, triceps, knees, and ankles. Plantar responses are flexor.  SENSORY: Intact to light touch, pinprick, positional and vibratory sensation are intact in fingers and toes.  COORDINATION: Rapid alternating movements and fine finger movements are intact. There is no dysmetria on finger-to-nose and heel-knee-shin.    GAIT/STANCE: Posture is normal. Gait is steady with normal steps, base, arm swing, and turning. Heel and toe walking are normal. Tandem gait is normal.  Romberg is absent.    DIAGNOSTIC DATA (LABS, IMAGING, TESTING)   ASSESSMENT AND PLAN  37 y.o. year old female   Chronic migraine headaches  Suboptimal control, continue have more than 15 headaches a single month   Continue nortriptyline 25 mg 3 tablets every night, Inderal LA 80 mg daily  Zomig as needed  Preauthorization for Botox as migraine prevention  BOTOX injection was performed according to protocol by [employer]. 100 units of BOTOX was dissolved into 2 cc NS.      Extra 45 units was injected along bilateral vertex region, upper cervical region Patient tolerate the injection well. Will return for repeat injection in 3 months.   Levert Feinstein, M.D. Ph.D.  [facility] [address] [place], [place] [address] Phone: [phone] Fax:      [phone]

## 2017-01-27 ENCOUNTER — Ambulatory Visit (HOSPITAL_COMMUNITY): Payer: Self-pay | Admitting: Psychology

## 2017-01-31 ENCOUNTER — Other Ambulatory Visit (HOSPITAL_COMMUNITY): Payer: Self-pay | Admitting: Psychiatry

## 2017-02-01 ENCOUNTER — Encounter (HOSPITAL_COMMUNITY): Payer: Self-pay | Admitting: Psychiatry

## 2017-02-01 ENCOUNTER — Ambulatory Visit (INDEPENDENT_AMBULATORY_CARE_PROVIDER_SITE_OTHER): Payer: PRIVATE HEALTH INSURANCE | Admitting: Psychiatry

## 2017-02-01 DIAGNOSIS — F410 Panic disorder [episodic paroxysmal anxiety] without agoraphobia: Secondary | ICD-10-CM | POA: Diagnosis not present

## 2017-02-01 DIAGNOSIS — F419 Anxiety disorder, unspecified: Secondary | ICD-10-CM | POA: Diagnosis not present

## 2017-02-01 DIAGNOSIS — R51 Headache: Secondary | ICD-10-CM

## 2017-02-01 DIAGNOSIS — R450 Nervousness: Secondary | ICD-10-CM | POA: Diagnosis not present

## 2017-02-01 DIAGNOSIS — F331 Major depressive disorder, recurrent, moderate: Secondary | ICD-10-CM

## 2017-02-01 DIAGNOSIS — F1099 Alcohol use, unspecified with unspecified alcohol-induced disorder: Secondary | ICD-10-CM | POA: Diagnosis not present

## 2017-02-01 DIAGNOSIS — Z811 Family history of alcohol abuse and dependence: Secondary | ICD-10-CM

## 2017-02-01 MED ORDER — FLUOXETINE HCL 10 MG PO CAPS: ORAL_CAPSULE | ORAL | 1 refills | Status: AC

## 2017-02-01 NOTE — Telephone Encounter (Signed)
I saw her once.  She was in the intensive outpatient program and recommendation was done by IOP.

## 2017-02-01 NOTE — Progress Notes (Signed)
BH MD/PA/NP OP Progress Note  02/01/2017 11:01 AM Abigail Henry  MRN:  [id number]  Chief Complaint: I still have panic attacks.  I did not resume work.  HPI: Patient is 37 year old African-American, married female who was seen first time on December 08, 2016.  Patient was referred from intensive outpatient program.  Patient has multiple stressors and recently her husband lost her job, father's health is deteriorating and she is having a lot of stress at work.  Patient works as a Psychologist, clinical at [employer] but lately she has been not going to work because of feeling overwhelmed.  She is also having persistent headaches.  She is seeing neurology.  We started her on Prozac.  She is taking 20 mg.  She feels Prozac helping some of her anxiety and depression as she is no longer having crying spells or any feeling of hopelessness but she continues to have anxiety attacks and panic attacks.  Her husband tried to take her to the work but she started to have panic attack and she could not go inside.  She stopped taking nortriptyline which was given by neurology for headache since we started her on Prozac.  Her husband finally able to get the job and today is his first day at work.  Patient recently received Botox injection for persistent migraine headaches.  She is a 93-year-old daughter and 66-year-old son.  Her parents live 2 hours away.  She is concerned about her father who has Raynard's disease.  She is sleeping better with Prozac.  She continues to smoke marijuana and sometimes social drinking when she has intense migraine headaches.  Patient denies any binge or any intoxication.  She is looking for a different job because she feels her current job is very stressful.  Patient has no tremors, shakes or any EPS.  She denies any suicidal thoughts or homicidal thoughts.  She denies any mania or psychosis.  Visit Diagnosis:    ICD-10-CM   1. MDD (major depressive disorder), recurrent episode, moderate (HCC)  F33.1 FLUoxetine (PROZAC) 10 MG capsule    Past Psychiatric History:  She denies any history of psychiatric inpatient treatment or any suicidal attempt.  She has done intensive outpatient program.  She was never taken antidepressant until we started her on Prozac.  Past Medical History:  Past Medical History:  Diagnosis Date  . Back pain   . Depression   . HA (headache)     Past Surgical History:  Procedure Laterality Date  . None      Family Psychiatric History: Reviewed.  Family History:  Family History  Problem Relation Age of Onset  . High blood pressure Mother   . Healthy Father   . Raynaud syndrome Father        in hands  . Alcohol abuse Maternal Uncle     Social History:  Social History   Socioeconomic History  . Marital status: Married    Spouse name: Abigail Henry  . Number of children: 2  . Years of education: 68  . Highest education level: None  Social Needs  . Financial resource strain: Patient refused  . Food insecurity - worry: Patient refused  . Food insecurity - inability: Patient refused  . Transportation needs - medical: Patient refused  . Transportation needs - non-medical: Patient refused  Occupational History    Employer: [employer]  Tobacco Use  . Smoking status: Never Smoker  . Smokeless tobacco: Never Used  Substance and Sexual Activity  . Alcohol  use: Yes    Alcohol/week: 0.6 oz    Types: 1 Cans of beer per week    Comment: 2 - 3 glasses on the weekend  . Drug use: No    Comment: 05/18/16 Marijuana occasionally for migraines  . Sexual activity: Yes  Other Topics Concern  . None  Social History Narrative   Patient lives at home with her husband Abigail Henry) and two children. Patient works full time. [employer] .     Caffeine- two cups    Right handed.    Allergies:  Allergies  Allergen Reactions  . Zonegran [Zonisamide] Other (See Comments)    hallucinating    Metabolic Disorder Labs: No results found for: HGBA1C, MPG No results found  for: PROLACTIN No results found for: CHOL, TRIG, HDL, CHOLHDL, VLDL, LDLCALC No results found for: TSH  Therapeutic Level Labs: No results found for: LITHIUM No results found for: VALPROATE No components found for:  CBMZ  Current Medications: Current Outpatient Medications  Medication Sig Dispense Refill  . diphenhydrAMINE (BENADRYL) 25 MG tablet Take 25 mg by mouth every 6 (six) hours as needed.    Marland Kitchen FLUoxetine (PROZAC) 10 MG capsule Take 1 capsule daily for 1 week and than 2 capsule daily 60 capsule 1  . loratadine (CLARITIN) 10 MG tablet Take 10 mg by mouth as needed for allergies.    . promethazine (PHENERGAN) 12.5 MG tablet Take 12.5 mg by mouth daily.    Marland Kitchen zolmitriptan (ZOMIG) 5 MG tablet Take 1 tablet (5 mg total) by mouth as needed for migraine. 12 tablet 11  . propranolol ER (INDERAL LA) 80 MG 24 hr capsule Take 1 capsule (80 mg total) by mouth daily. (Patient not taking: Reported on 02/01/2017) 90 capsule 1   No current facility-administered medications for this visit.      Musculoskeletal: Strength & Muscle Tone: within normal limits Gait & Station: normal Patient leans: N/A  Psychiatric Specialty Exam: Review of Systems  Neurological: Positive for headaches.  Psychiatric/Behavioral: Positive for depression. The patient is nervous/anxious.     Blood pressure 122/70, pulse (!) 105, height 5\' 2"  (1.575 m), weight 168 lb 3.2 oz (76.3 kg).Body mass index is 30.76 kg/m.  General Appearance: Casual  Eye Contact:  Fair  Speech:  Slow  Volume:  Normal  Mood:  Anxious and Depressed  Affect:  Constricted  Thought Process:  Goal Directed  Orientation:  Full (Time, Place, and Person)  Thought Content: Rumination   Suicidal Thoughts:  No  Homicidal Thoughts:  No  Memory:  Immediate;   Good Recent;   Good Remote;   Good  Judgement:  Good  Insight:  Good  Psychomotor Activity:  Normal  Concentration:  Concentration: Fair and Attention Span: Fair  Recall:  Good  Fund of  Knowledge: Good  Language: Good  Akathisia:  No  Handed:  Right  AIMS (if indicated): not done  Assets:  Communication Skills Desire for Improvement Housing Resilience  ADL's:  Intact  Cognition: WNL  Sleep:  Improved   Screenings:   Assessment and Plan: Major depressive disorder, recurrent.  Panic attacks.  I review her symptoms, collect information from other providers and current medication.  Recommended to try Prozac 30 mg daily.  She is getting Botox injection from neurology for her headaches.  I discussed that she should consider going back to part-time work if her job allows.  Patient will discuss with the HR and also she will discussed with the neurology if she can  resume 4 hours a day work.  Currently patient does not have any side effects of medication.  Encouraged to keep appointment with Forde RadonLeanne Yates for counseling.  Recommended to call us back if she has any question, concern or if she feels worsening of the symptoms.  Follow-up in 2-3 months.   Cleotis NipperSyed T Miosha Behe, MD 02/01/2017, 11:01 AM

## 2017-02-03 ENCOUNTER — Ambulatory Visit (HOSPITAL_COMMUNITY): Payer: Self-pay | Admitting: Psychology

## 2017-02-03 ENCOUNTER — Telehealth (HOSPITAL_COMMUNITY): Payer: Self-pay

## 2017-02-03 ENCOUNTER — Other Ambulatory Visit (HOSPITAL_COMMUNITY): Payer: Self-pay | Admitting: Psychiatry

## 2017-02-03 DIAGNOSIS — F331 Major depressive disorder, recurrent, moderate: Secondary | ICD-10-CM

## 2017-02-03 NOTE — Telephone Encounter (Signed)
Cannot send 90-day supply.  We are still in the process of adjusting the dose

## 2017-02-03 NOTE — Telephone Encounter (Signed)
Medication manangement - Fax received from pt's [facility] for request of a new Fluoxetine order for 90 days, per patient's insurance requirement.

## 2017-02-03 NOTE — Telephone Encounter (Signed)
Called and informed the pharmacist

## 2017-02-08 ENCOUNTER — Telehealth: Payer: Self-pay | Admitting: Neurology

## 2017-02-08 NOTE — Telephone Encounter (Signed)
Patient had Botox on 01-26-17. About 3 days ago she started experiencing blurred vision. Could this be coming from the Botox?

## 2017-02-08 NOTE — Telephone Encounter (Signed)
Dr. Terrace ArabiaYan reviewed patient's chart.  She injected Botox on 01/26/17 in a normal protocol pattern for migraines.  She does not think her blurred vision is related to the Botox.  However, if her symptoms do not improve, she is happy to evaluate the patient further in office.  Left patient a detailed message on voicemail (ok per DPR).  Provided our number to call back with any questions.

## 2017-02-10 ENCOUNTER — Other Ambulatory Visit (HOSPITAL_COMMUNITY): Payer: Self-pay | Admitting: Psychiatry

## 2017-02-10 ENCOUNTER — Ambulatory Visit (HOSPITAL_COMMUNITY): Payer: Self-pay | Admitting: Psychology

## 2017-02-10 DIAGNOSIS — F331 Major depressive disorder, recurrent, moderate: Secondary | ICD-10-CM

## 2017-02-11 ENCOUNTER — Other Ambulatory Visit (HOSPITAL_COMMUNITY): Payer: Self-pay | Admitting: Psychiatry

## 2017-02-17 ENCOUNTER — Ambulatory Visit (HOSPITAL_COMMUNITY): Payer: Self-pay | Admitting: Psychology

## 2017-02-21 ENCOUNTER — Ambulatory Visit: Payer: 59 | Admitting: Nurse Practitioner

## 2017-02-24 ENCOUNTER — Ambulatory Visit (INDEPENDENT_AMBULATORY_CARE_PROVIDER_SITE_OTHER): Payer: PRIVATE HEALTH INSURANCE | Admitting: Psychology

## 2017-02-24 DIAGNOSIS — F330 Major depressive disorder, recurrent, mild: Secondary | ICD-10-CM

## 2017-02-24 NOTE — Addendum Note (Signed)
Addended by: Clarene EssexYATES, Lukka Black M on: 02/24/2017 10:59 AM   Modules accepted: Level of Service

## 2017-02-24 NOTE — Progress Notes (Signed)
   THERAPIST PROGRESS NOTE  Session Time: 10.05am-10.45am  Participation Level: Active  Behavioral Response: Well GroomedAlertEuthymic  Type of Therapy: Individual Therapy  Treatment Goals addressed: Diagnosis: MDd and goal 1.  Interventions: CBT  Summary: Abigail FinesQuiana Henry is a 37 y.o. female who presents with affect full and bright.  Pt reported that she didn't go back to work- pt reported that just felt to overwhelmed with and didn't want to be back in that enviornment.  Pt reported that they let her go- last day was 02/22/17 and that she feels so relieved and knows that this was best for her. Pt reported that she hasn't been just at home doing nothing.  She has felt like getting things done- around the house, applying for jobs and looking to return to school.  Pt reported that she is able to recognize when things getting too much and takes time for her self care.  Pt reported that has felt more tolerant as well w/ frustrations of children and w/ mom.  Pt reports she has been able to say no and assert herself w/others.     Suicidal/Homicidal: Nowithout intent/plan  Therapist Response: Assessed pt current functioning per pt report. Processed w/ pt coping w/ transition- leaving her job.  Explored w/pt coping skills using and reflected strengths.  Discussed pt next steps.   Plan: Return again in 2 weeks.  Diagnosis: MDD   Forde RadonYATES,Abigail Henry, [facility] 02/24/2017

## 2017-03-06 ENCOUNTER — Emergency Department (HOSPITAL_COMMUNITY)
Admission: EM | Admit: 2017-03-06 | Discharge: 2017-03-06 | Disposition: A | Discharge status: Home / Self Care | Payer: 59 | Attending: Emergency Medicine | Admitting: Emergency Medicine

## 2017-03-06 ENCOUNTER — Other Ambulatory Visit (HOSPITAL_COMMUNITY): Payer: Self-pay | Admitting: Psychiatry

## 2017-03-06 ENCOUNTER — Other Ambulatory Visit: Payer: Self-pay

## 2017-03-06 ENCOUNTER — Encounter (HOSPITAL_COMMUNITY): Payer: Self-pay

## 2017-03-06 DIAGNOSIS — M546 Pain in thoracic spine: Secondary | ICD-10-CM | POA: Diagnosis present

## 2017-03-06 DIAGNOSIS — R51 Headache: Secondary | ICD-10-CM | POA: Insufficient documentation

## 2017-03-06 DIAGNOSIS — Z041 Encounter for examination and observation following transport accident: Secondary | ICD-10-CM | POA: Diagnosis not present

## 2017-03-06 DIAGNOSIS — M542 Cervicalgia: Secondary | ICD-10-CM | POA: Diagnosis not present

## 2017-03-06 DIAGNOSIS — F331 Major depressive disorder, recurrent, moderate: Secondary | ICD-10-CM

## 2017-03-06 DIAGNOSIS — M25551 Pain in right hip: Secondary | ICD-10-CM | POA: Insufficient documentation

## 2017-03-06 MED ORDER — IBUPROFEN 800 MG PO TABS
800.0000 mg | ORAL_TABLET | Freq: Once | ORAL | Status: AC
  Administered 2017-03-06: 800 mg via ORAL
  Filled 2017-03-06: qty 1

## 2017-03-06 MED ORDER — METHOCARBAMOL 500 MG PO TABS: 500.0000 mg | ORAL_TABLET | Freq: Three times a day (TID) | ORAL | 0 refills | Status: AC | PRN

## 2017-03-06 MED ORDER — METHOCARBAMOL 500 MG PO TABS
500.0000 mg | ORAL_TABLET | Freq: Once | ORAL | Status: AC
  Administered 2017-03-06: 500 mg via ORAL
  Filled 2017-03-06: qty 1
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
## 2017-03-06 NOTE — Discharge Instructions (Signed)
You may alternate Tylenol 1000 mg every 6 hours as needed for pain and Ibuprofen 800 mg every 8 hours as needed for pain.  Please take Ibuprofen with food. ° °

## 2017-03-10 ENCOUNTER — Ambulatory Visit (INDEPENDENT_AMBULATORY_CARE_PROVIDER_SITE_OTHER): Payer: PRIVATE HEALTH INSURANCE | Admitting: Psychology

## 2017-03-10 DIAGNOSIS — F330 Major depressive disorder, recurrent, mild: Secondary | ICD-10-CM
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
## 2017-03-15 ENCOUNTER — Ambulatory Visit (HOSPITAL_COMMUNITY): Payer: Self-pay | Admitting: Psychiatry

## 2017-03-24 ENCOUNTER — Ambulatory Visit (HOSPITAL_COMMUNITY): Payer: Self-pay | Admitting: Psychology

## 2017-04-11 ENCOUNTER — Ambulatory Visit (HOSPITAL_COMMUNITY): Payer: Self-pay | Admitting: Psychology

## 2017-04-11 ENCOUNTER — Encounter (HOSPITAL_COMMUNITY): Payer: Self-pay | Admitting: Psychology

## 2017-04-11 NOTE — Progress Notes (Signed)
Abigail Henry is a 37 y.o. female patient who didn't show for appointment. letter sent.        Forde RadonYATES,LEANNE, LPC

## 2017-04-27 ENCOUNTER — Telehealth: Payer: Self-pay | Admitting: Neurology

## 2017-04-27 NOTE — Telephone Encounter (Signed)
The pharmacy is stating that the patients insurance termed in March of this year. I called the patient to inquire but she did not answer so I left a VM asking her to call me back.

## 2017-05-03 NOTE — Telephone Encounter (Signed)
Pt has called to inform that she just got approved for Medicaid and she wanted to [place] the [place]. Please call

## 2017-05-04 ENCOUNTER — Ambulatory Visit: Payer: 59 | Admitting: Neurology

## 2017-05-04 NOTE — Telephone Encounter (Signed)
I spoke with patient. Her [place] [facility], XB:[id number] S. Patient said she will call back to schedule once she has received the approval letter from [facility] [facility].

## 2017-08-24 ENCOUNTER — Encounter (HOSPITAL_COMMUNITY): Payer: Self-pay | Admitting: Psychology

## 2017-08-24 NOTE — Progress Notes (Signed)
Abigail Henry Knoff is a 37 y.o. female patient discharge from counseling as last seen on 03/10/17.     Outpatient Therapist Discharge Summary  Abigail Henry Rybacki    10/01/1980   Admission Date: 12/07/16   Discharge Date:  08/24/17 Reason for Discharge:  Not active Diagnosis:  MDD Comments:  May return as needed  Alfredo BattyLeanne M Moustapha Tooker    Sourish Allender, [facility]

## 2017-10-25 ENCOUNTER — Encounter: Payer: Self-pay | Admitting: *Deleted

## 2017-10-31 ENCOUNTER — Ambulatory Visit: Payer: Self-pay | Admitting: Family Medicine

## 2018-10-17 ENCOUNTER — Encounter: Payer: Self-pay | Admitting: Physical Therapy

## 2018-10-17 ENCOUNTER — Other Ambulatory Visit: Payer: Self-pay

## 2018-10-17 ENCOUNTER — Ambulatory Visit: Payer: Medicaid Other | Attending: Orthopaedic Surgery | Admitting: Physical Therapy

## 2018-10-17 DIAGNOSIS — R262 Difficulty in walking, not elsewhere classified: Secondary | ICD-10-CM

## 2018-10-17 DIAGNOSIS — M25561 Pain in right knee: Secondary | ICD-10-CM | POA: Diagnosis present

## 2018-10-17 DIAGNOSIS — G8929 Other chronic pain: Secondary | ICD-10-CM | POA: Insufficient documentation

## 2018-10-18 NOTE — Therapy (Signed)
[place], [place], [address] Phone: [phone]   Fax:  [phone]  Physical Therapy Evaluation  Patient Details  Name: Abigail Henry MRN: [id number] Date of Birth: 31-Dec-1980 Referring Provider (PT): Ophelia Charter, MD   Encounter Date: 10/17/2018  PT End of Session - 10/17/18 1637    Visit Number  1    Number of Visits  --    Date for PT Re-Evaluation  --    Authorization Type  Med pay/MEDICAID   -submitted 9/23    PT Start Time  1602   pt not checked in   PT Stop Time  1637    PT Time Calculation (min)  35 min    Activity Tolerance  Patient tolerated treatment well    Behavior During Therapy  [facility] for tasks assessed/performed       Past Medical History:  Diagnosis Date  . Back pain   . Depression   . HA (headache)     Past Surgical History:  Procedure Laterality Date  . None      There were no vitals filed for this visit.   Subjective Assessment - 10/17/18 1611    Subjective  Not quite sure what happened. I did cheerleading and ran track. Has been off and on for about a year, in the last 3-6 mo pain has increased. Hurts to climb stairs, work out, plyometrics, night pain, being still 20-30 min, sit on floor with legs crossed, flex knee. I think I have been favoring the leg and now the Left hurts. I have been having a nerve pain- at night, no matter which side I lay on, my legs go numb and I have to toss and turn so my knee hurts. My hands and feet tingle but not sure if it is from migraine meds or this problem. When I was younger, I had mild scoliosis.    Patient Stated Goals  decrease pain    Currently in Pain?  No/denies         [facility] PT Assessment - 10/18/18 0001      Assessment   Medical Diagnosis  right knee pain    Referring Provider (PT)  Ophelia Charter, MD    Onset Date/Surgical Date  09/25/17    Hand Dominance  Right    Next MD Visit  PRN      Precautions   Precautions  None       Restrictions   Weight Bearing Restrictions  No      [place] residence    Additional Comments  steps to enter home      Prior Function   Vocation  Full time employment    Vocation Requirements  CNA      Cognition   Overall Cognitive Status  Within Functional Limits for tasks assessed      Strength   Right Hip Flexion  4/5    Right Hip Extension  5/5    Right Hip ABduction  5/5    Left Hip Flexion  4/5    Left Hip Extension  5/5    Left Hip ABduction  5/5    Right Knee Flexion  4/5    Right Knee Extension  4+/5    Left Knee Flexion  4+/5    Left Knee Extension  5/5                Objective measurements completed on examination: See above findings.  PT Education - 10/18/18 1245    Education Details  anatomy of condition, POC, HEP, exercise form/rationale    Person(s) Educated  Patient    Methods  Explanation;Demonstration;Tactile cues;Verbal cues;Handout    Comprehension  Verbalized understanding;Returned demonstration;Verbal cues required;Tactile cues required;Need further instruction       PT Short Term Goals - 10/18/18 1250      PT SHORT TERM GOAL #1   Title  pt will be independent with HEP as it has been established in the short term    Baseline  began establishing at eval    Time  3    Period  Weeks    Status  New    Target Date  11/10/18      PT SHORT TERM GOAL #2   Title  pt will be aware to correct knee posture in resting stance    Baseline  genu recurvatum at eval    Time  3    Period  Weeks    Status  New    Target Date  11/10/18      PT SHORT TERM GOAL #3   Title  able to navigate stairs step over step with mild discomfort    Baseline  modifies due to pain at eval    Time  3    Period  Weeks    Status  New    Target Date  11/10/18        PT Long Term Goals - 10/18/18 1313      PT LONG TERM GOAL #1   Title  to be set at re-evaluation             Plan -  10/17/18 1637    Clinical Impression Statement  Pt presents to PT with complaints of bilateral knee pain but referred for Rt. Insidious onset in the last year but has worsened in the last 3-6 mo. Bil knee hyperextension in standing with anterior pelvic tilt. Weakness in hamstring. Lt pelvic elevation in tanding with Rt leg shorter in supine. Pt knows she has lumbar levoscoliosis but has never had treatment, denies back/hip pain so we will focus on strength and stability in distal structures as well as core stability. Pt will benefit from skilled PT in order to decrease pain and improve function.    Personal Factors and Comorbidities  Past/Current Experience;Comorbidity 1    Comorbidities  depression    Examination-Activity Limitations  Bed Mobility;Locomotion Level;Bend;Sit;Carry;Sleep;Squat;Stairs;Stand;Hygiene/Grooming;Lift    Examination-Participation Restrictions  Meal Prep;Cleaning;Community Activity;Shop;Laundry    Stability/Clinical Decision Making  Stable/Uncomplicated    Clinical Decision Making  Low    Rehab Potential  Good    PT Frequency  --   3 visits in first auth   PT Treatment/Interventions  ADLs/Self Care Home Management;Cryotherapy;Electrical Stimulation;Ultrasound;Moist Heat;Iontophoresis 4mg /ml Dexamethasone;Gait training;Stair training;Functional mobility training;Therapeutic activities;Therapeutic exercise;Patient/family education;Neuromuscular re-education;Manual techniques;Passive range of motion;Dry needling;Taping    PT Next Visit Plan  **explain MCD authorization, continue HS strength, core strength to correct ant pelvic tilt at rest    PT Home Exercise Plan  seated hamstring curl, SLS with knee bent, stand>sit slow with ball bw knees    Consulted and Agree with Plan of Care  Patient       Patient will benefit from skilled therapeutic intervention in order to improve the following deficits and impairments:  Difficulty walking, Increased muscle spasms, Decreased activity  tolerance, Pain, Improper body mechanics, Decreased strength, Postural dysfunction  Visit Diagnosis: Chronic pain of right knee - Plan: PT plan  of care cert/re-cert  Difficulty in walking, not elsewhere classified - Plan: PT plan of care cert/re-cert     Problem List Patient Active Problem List   Diagnosis Date Noted  . Migraine, chronic, without aura 01/26/2017  . Moderate major depression, single episode (HCC) 11/15/2016    Class: Acute  . Low back pain 12/02/2014  . Chronic migraine without aura or status migrainosus 09/29/2012   Curlee Bogan C. Lile Mccurley PT, [name] 10/18/18 1:16 PM   [facility] [address] [place], [place], [address] Phone: [phone]   Fax:  [phone]  Name: Revonda Noblin MRN: [id number] Date of Birth: 03/26/1980

## 2018-11-01 ENCOUNTER — Encounter: Payer: Self-pay | Admitting: Physical Therapy

## 2018-11-01 ENCOUNTER — Ambulatory Visit: Payer: Medicaid Other | Attending: Orthopaedic Surgery | Admitting: Physical Therapy

## 2018-11-01 ENCOUNTER — Other Ambulatory Visit: Payer: Self-pay

## 2018-11-01 DIAGNOSIS — G8929 Other chronic pain: Secondary | ICD-10-CM | POA: Insufficient documentation

## 2018-11-01 DIAGNOSIS — R262 Difficulty in walking, not elsewhere classified: Secondary | ICD-10-CM | POA: Insufficient documentation

## 2018-11-01 DIAGNOSIS — M25561 Pain in right knee: Secondary | ICD-10-CM | POA: Insufficient documentation

## 2018-11-01 NOTE — Therapy (Signed)
[place], [place], [address] Phone: [phone]   Fax:  [phone]  Physical Therapy Treatment  Patient Details  Name: Abigail Henry MRN: [id number] Date of Birth: 05-05-1980 Referring Provider (PT): Ophelia Charter, MD   Encounter Date: 11/01/2018  PT End of Session - 11/01/18 1546    Visit Number  2    Authorization Type  Med pay/MEDICAID    Authorization Time Period  10/6-10/19    Authorization - Visit Number  1    Authorization - Number of Visits  3    PT Start Time  1545    PT Stop Time  1627    PT Time Calculation (min)  42 min    Activity Tolerance  Patient tolerated treatment well    Behavior During Therapy  [facility] for tasks assessed/performed       Past Medical History:  Diagnosis Date  . Back pain   . Depression   . HA (headache)     Past Surgical History:  Procedure Laterality Date  . None      There were no vitals filed for this visit.  Subjective Assessment - 11/01/18 1548    Subjective  Was doing really well but I hurt for some reason today.    Currently in Pain?  Yes    Pain Score  5     Pain Location  Knee    Pain Orientation  Right    Pain Descriptors / Indicators  Sore;Aching    Aggravating Factors   laying down at night                       [facility] Adult PT Treatment/Exercise - 11/01/18 0001      Exercises   Exercises  Lumbar      Lumbar Exercises: Stretches   Figure 4 Stretch  2 reps;30 seconds      Lumbar Exercises: Standing   Other Standing Lumbar Exercises  mini squat with side stepping- cues for core    Other Standing Lumbar Exercises  antirotation      Lumbar Exercises: Supine   Pelvic Tilt Limitations  posterior pelvic tilt- core+breathing    Other Supine Lumbar Exercises  pelvic tilt+: march, LE ext, SLR      Lumbar Exercises: Fayne Mediate  elbows/knees      Manual Therapy   Manual Therapy  Soft tissue mobilization    Soft tissue  mobilization  bil lumbar paraspinals & hips               PT Short Term Goals - 10/18/18 1250      PT SHORT TERM GOAL #1   Title  pt will be independent with HEP as it has been established in the short term    Baseline  began establishing at eval    Time  3    Period  Weeks    Status  New    Target Date  11/10/18      PT SHORT TERM GOAL #2   Title  pt will be aware to correct knee posture in resting stance    Baseline  genu recurvatum at eval    Time  3    Period  Weeks    Status  New    Target Date  11/10/18      PT SHORT TERM GOAL #3   Title  able to navigate stairs step over step with mild discomfort  Baseline  modifies due to pain at eval    Time  3    Period  Weeks    Status  New    Target Date  11/10/18        PT Long Term Goals - 10/18/18 1313      PT LONG TERM GOAL #1   Title  to be set at re-evaluation            Plan - 11/01/18 1628    Clinical Impression Statement  Heavy verbal and tactile cuing for posterior pelvic tilt to engage abdominal wall. HEP for core stability started today. antirotation created radicular symptoms so that exercis will be held.    PT Treatment/Interventions  ADLs/Self Care Home Management;Cryotherapy;Electrical Stimulation;Ultrasound;Moist Heat;Iontophoresis 4mg /ml Dexamethasone;Gait training;Stair training;Functional mobility training;Therapeutic activities;Therapeutic exercise;Patient/family education;Neuromuscular re-education;Manual techniques;Passive range of motion;Dry needling;Taping    PT Next Visit Plan  **explain MCD authorization, continue HS strength, progress core stability    PT Home Exercise Plan  seated hamstring curl, SLS with knee bent, stand>sit slow with ball bw knees; post pelvic tilt- with march, with SLR; plank, mini squat with side stepping    Consulted and Agree with Plan of Care  Patient       Patient will benefit from skilled therapeutic intervention in order to improve the following  deficits and impairments:  Difficulty walking, Increased muscle spasms, Decreased activity tolerance, Pain, Improper body mechanics, Decreased strength, Postural dysfunction  Visit Diagnosis: Chronic pain of right knee  Difficulty in walking, not elsewhere classified     Problem List Patient Active Problem List   Diagnosis Date Noted  . Migraine, chronic, without aura 01/26/2017  . Moderate major depression, single episode (HCC) 11/15/2016    Class: Acute  . Low back pain 12/02/2014  . Chronic migraine without aura or status migrainosus 09/29/2012    Jaden Batchelder C. Samad Thon PT, [name] 11/01/18 4:31 PM   [facility] [address] [place], [place], [place] Phone: [phone]   Fax:  [phone]  Name: Abigail Henry MRN: [name] Date of Birth: 1980/02/24

## 2018-11-02 ENCOUNTER — Ambulatory Visit: Payer: Medicaid Other | Admitting: Physical Therapy

## 2018-11-02 DIAGNOSIS — R262 Difficulty in walking, not elsewhere classified: Secondary | ICD-10-CM

## 2018-11-02 DIAGNOSIS — G8929 Other chronic pain: Secondary | ICD-10-CM

## 2018-11-02 DIAGNOSIS — M25561 Pain in right knee: Secondary | ICD-10-CM | POA: Diagnosis not present

## 2018-11-02 NOTE — Therapy (Signed)
[place], [place], [address] Phone: [phone]   Fax:  [phone]  Physical Therapy Treatment  Patient Details  Name: Abigail Henry MRN: [id number] Date of Birth: Oct 16, 1980 Referring Provider (PT): Ophelia Charter, MD   Encounter Date: 11/02/2018  PT End of Session - 11/02/18 1447    Visit Number  3    Authorization Type  Med pay/MEDICAID    Authorization Time Period  10/6-10/19    Authorization - Visit Number  1    Authorization - Number of Visits  3    PT Start Time  1100    PT Stop Time  1140    PT Time Calculation (min)  40 min    Activity Tolerance  Patient tolerated treatment well    Behavior During Therapy  [facility] for tasks assessed/performed       Past Medical History:  Diagnosis Date  . Back pain   . Depression   . HA (headache)     Past Surgical History:  Procedure Laterality Date  . None      There were no vitals filed for this visit.  Subjective Assessment - 11/02/18 1111    Subjective  Patient reports she is better today. She had alittle pain in her knee this morning.    Patient Stated Goals  decrease pain    Currently in Pain?  Yes    Pain Score  4     Pain Location  Knee    Pain Orientation  Right    Pain Descriptors / Indicators  Aching    Pain Type  Chronic pain    Pain Onset  More than a month ago    Pain Frequency  Constant    Aggravating Factors   laying down at night    Pain Relieving Factors  al little walking or changing position    Multiple Pain Sites  No                       [facility] Adult PT Treatment/Exercise - 11/02/18 0001      Self-Care   Self-Care  Other Self-Care Comments    Other Self-Care Comments   reccomened pillow between her legs for sleep.       Lumbar Exercises: Stretches   Figure 4 Stretch  2 reps;30 seconds      Lumbar Exercises: Supine   Pelvic Tilt Limitations  posterior pelvic tilt- core+breathing    Other Supine Lumbar  Exercises  pelvic tilt+: march,, SLR x15 each       Manual Therapy   Manual Therapy  Manual Traction    Soft tissue mobilization  bil lumbar paraspinals & hips    Manual Traction  neurologists feels like she could be having nerve compression in the groin. Trialed bilateral LAD 3x30 sec each              PT Education - 11/02/18 1446    Education Details  reviewed HEp and symptom mangement    Methods  Explanation;Demonstration;Tactile cues;Verbal cues    Comprehension  Verbalized understanding;Returned demonstration;Verbal cues required;Tactile cues required       PT Short Term Goals - 10/18/18 1250      PT SHORT TERM GOAL #1   Title  pt will be independent with HEP as it has been established in the short term    Baseline  began establishing at eval    Time  3    Period  Weeks  Status  New    Target Date  11/10/18      PT SHORT TERM GOAL #2   Title  pt will be aware to correct knee posture in resting stance    Baseline  genu recurvatum at eval    Time  3    Period  Weeks    Status  New    Target Date  11/10/18      PT SHORT TERM GOAL #3   Title  able to navigate stairs step over step with mild discomfort    Baseline  modifies due to pain at eval    Time  3    Period  Weeks    Status  New    Target Date  11/10/18        PT Long Term Goals - 10/18/18 1313      PT LONG TERM GOAL #1   Title  to be set at re-evaluation            Plan - 11/02/18 1447    Clinical Impression Statement  Patient had no increase in pain with treatment. Therapy added LAD 2nd to report of neurologists thought that she may have some type of groin impingement effecting her at night. She was also advised to use a pillow between her legs. She had minor psasming in her back. Therapywill continue to progress exercises and work on manual therapy to reduce muscle spasms and pain.    Personal Factors and Comorbidities  Past/Current Experience;Comorbidity 1    Comorbidities  depression     Examination-Activity Limitations  Bed Mobility;Locomotion Level;Bend;Sit;Carry;Sleep;Squat;Stairs;Stand;Hygiene/Grooming;Lift    Examination-Participation Restrictions  Meal Prep;Cleaning;Community Activity;Shop;Laundry    Stability/Clinical Decision Making  Stable/Uncomplicated    Clinical Decision Making  Low    Rehab Potential  Good    PT Frequency  2x / week    PT Duration  8 weeks    PT Treatment/Interventions  ADLs/Self Care Home Management;Cryotherapy;Electrical Stimulation;Ultrasound;Moist Heat;Iontophoresis 4mg /ml Dexamethasone;Gait training;Stair training;Functional mobility training;Therapeutic activities;Therapeutic exercise;Patient/family education;Neuromuscular re-education;Manual techniques;Passive range of motion;Dry needling;Taping    PT Next Visit Plan  **explain MCD authorization, continue HS strength, progress core stability    PT Home Exercise Plan  seated hamstring curl, SLS with knee bent, stand>sit slow with ball bw knees; post pelvic tilt- with march, with SLR; plank, mini squat with side stepping    Consulted and Agree with Plan of Care  Patient       Patient will benefit from skilled therapeutic intervention in order to improve the following deficits and impairments:  Difficulty walking, Increased muscle spasms, Decreased activity tolerance, Pain, Improper body mechanics, Decreased strength, Postural dysfunction  Visit Diagnosis: Chronic pain of right knee  Difficulty in walking, not elsewhere classified     Problem List Patient Active Problem List   Diagnosis Date Noted  . Migraine, chronic, without aura 01/26/2017  . Moderate major depression, single episode (HCC) 11/15/2016    Class: Acute  . Low back pain 12/02/2014  . Chronic migraine without aura or status migrainosus 09/29/2012    11/29/2012 PT [name]  11/02/2018, 2:51 PM  [facility] [address] [place], [place], [place] Phone:  [phone]   Fax:  [phone]  Name: Abigail Henry MRN: [name] Date of Birth: 22-May-1980

## 2018-11-07 ENCOUNTER — Ambulatory Visit: Payer: Medicaid Other | Admitting: Physical Therapy

## 2018-11-07 ENCOUNTER — Encounter: Payer: Self-pay | Admitting: Physical Therapy

## 2018-11-07 ENCOUNTER — Other Ambulatory Visit: Payer: Self-pay

## 2018-11-07 DIAGNOSIS — G8929 Other chronic pain: Secondary | ICD-10-CM

## 2018-11-07 DIAGNOSIS — R262 Difficulty in walking, not elsewhere classified: Secondary | ICD-10-CM

## 2018-11-07 DIAGNOSIS — M25561 Pain in right knee: Secondary | ICD-10-CM | POA: Diagnosis not present

## 2018-11-07 NOTE — Therapy (Signed)
[place], [place], [address] Phone: [phone]   Fax:  [phone]  Physical Therapy Treatment  Patient Details  Name: Abigail Henry MRN: [id number] Date of Birth: Sep 27, 1980 Referring Provider (PT): Ophelia Charter, MD   Encounter Date: 11/07/2018    Past Medical History:  Diagnosis Date  . Back pain   . Depression   . HA (headache)     Past Surgical History:  Procedure Laterality Date  . None      There were no vitals filed for this visit.  Subjective Assessment - 11/07/18 1107    Subjective  Patient continues to have low back pain and radiating pain at night. On Friday she flet like she pulled her back and it hasn't been right since.    Patient Stated Goals  decrease pain    Currently in Pain?  Yes    Pain Score  6     Pain Location  Back    Pain Orientation  Mid    Pain Descriptors / Indicators  Aching    Pain Type  Chronic pain    Pain Onset  More than a month ago    Pain Frequency  Constant    Aggravating Factors   laying down at night    Pain Relieving Factors  walking and changin position    Multiple Pain Sites  No                       [facility] Adult PT Treatment/Exercise - 11/07/18 0001      Lumbar Exercises: Stretches   Figure 4 Stretch  2 reps;30 seconds      Lumbar Exercises: Standing   Other Standing Lumbar Exercises  mini squat with side step x10 each direction    Other Standing Lumbar Exercises  pallof press 3kg x10 each direction/ Cable chops x10 each side       Lumbar Exercises: Supine   Pelvic Tilt Limitations  posterior pelvic tilt- core+breathing    Bridge Limitations  bridge on ball 2x10; double knee to chest with basll 2x10     Other Supine Lumbar Exercises  leg lengthener with breathing;     Other Supine Lumbar Exercises  SLR x10 with abdoominal breathing       Manual Therapy   Manual Therapy  Manual Traction    Soft tissue mobilization  bil lumbar  paraspinals & hips using IASTYM and manual trigger point release.     Manual Traction  LAD 3x30 sec hold 2nd to MD repotrt of postential nerve compression in her groin             PT Education - 11/07/18 1109    Education Details  reviewed improtanc eof posture    Person(s) Educated  Patient    Methods  Explanation;Demonstration;Tactile cues;Verbal cues    Comprehension  Verbalized understanding;Returned demonstration;Tactile cues required;Verbal cues required       PT Short Term Goals - 10/18/18 1250      PT SHORT TERM GOAL #1   Title  pt will be independent with HEP as it has been established in the short term    Baseline  began establishing at eval    Time  3    Period  Weeks    Status  New    Target Date  11/10/18      PT SHORT TERM GOAL #2   Title  pt will be aware to correct knee posture in  resting stance    Baseline  genu recurvatum at eval    Time  3    Period  Weeks    Status  New    Target Date  11/10/18      PT SHORT TERM GOAL #3   Title  able to navigate stairs step over step with mild discomfort    Baseline  modifies due to pain at eval    Time  3    Period  Weeks    Status  New    Target Date  11/10/18        PT Long Term Goals - 10/18/18 1313      PT LONG TERM GOAL #1   Title  to be set at re-evaluation            Plan - 11/07/18 2108    Clinical Impression Statement  Patient continues to hvae tenderness to palpation in her lower back but she is not longer having pain in her knee. It seems as though her pain may be centralizing. Therapy worked on manual therapy to help with her pelvic allignment. She had less pain after manual therapy. She was also given the leg lenthener exercise. Therapy advanced her bridges to bridges on the ball. She tolerated advanced core and knee exercises well.    Personal Factors and Comorbidities  Past/Current Experience;Comorbidity 1    Comorbidities  depression    Examination-Activity Limitations  Bed  Mobility;Locomotion Level;Bend;Sit;Carry;Sleep;Squat;Stairs;Stand;Hygiene/Grooming;Lift    PT Frequency  2x / week    PT Duration  8 weeks    PT Treatment/Interventions  ADLs/Self Care Home Management;Cryotherapy;Electrical Stimulation;Ultrasound;Moist Heat;Iontophoresis 4mg /ml Dexamethasone;Gait training;Stair training;Functional mobility training;Therapeutic activities;Therapeutic exercise;Patient/family education;Neuromuscular re-education;Manual techniques;Passive range of motion;Dry needling;Taping    PT Next Visit Plan  continue to progresscore stability; continue with manual therapy    PT Home Exercise Plan  seated hamstring curl, SLS with knee bent, stand>sit slow with ball bw knees; post pelvic tilt- with march, with SLR; plank, mini squat with side stepping    Consulted and Agree with Plan of Care  Patient       Patient will benefit from skilled therapeutic intervention in order to improve the following deficits and impairments:  Difficulty walking, Increased muscle spasms, Decreased activity tolerance, Pain, Improper body mechanics, Decreased strength, Postural dysfunction  Visit Diagnosis: Chronic pain of right knee  Difficulty in walking, not elsewhere classified     Problem List Patient Active Problem List   Diagnosis Date Noted  . Migraine, chronic, without aura 01/26/2017  . Moderate major depression, single episode (HCC) 11/15/2016    Class: Acute  . Low back pain 12/02/2014  . Chronic migraine without aura or status migrainosus 09/29/2012    11/29/2012 11/07/2018, 9:20 PM  [facility] [address] [place], [place], [place] Phone: [phone]   Fax:  [phone]  Name: Abigail Henry MRN: [name] Date of Birth: Sep 04, 1980

## 2018-11-09 ENCOUNTER — Ambulatory Visit: Payer: Medicaid Other | Admitting: Physical Therapy

## 2018-11-09 ENCOUNTER — Other Ambulatory Visit: Payer: Self-pay

## 2018-11-09 ENCOUNTER — Encounter: Payer: Self-pay | Admitting: Physical Therapy

## 2018-11-09 DIAGNOSIS — R262 Difficulty in walking, not elsewhere classified: Secondary | ICD-10-CM

## 2018-11-09 DIAGNOSIS — M25561 Pain in right knee: Secondary | ICD-10-CM

## 2018-11-09 DIAGNOSIS — G8929 Other chronic pain: Secondary | ICD-10-CM

## 2018-11-09 NOTE — Therapy (Signed)
[place], [place], [address] Phone: [phone]   Fax:  [phone]  Physical Therapy Treatment/ERO  Patient Details  Name: Abigail Henry MRN: [id number] Date of Birth: 21-Jun-1980 Referring Provider (PT): Ophelia Charter, MD   Encounter Date: 11/09/2018  PT End of Session - 11/09/18 2056    Visit Number  5    Date for PT Re-Evaluation  12/14/18    Authorization Type  Med pay/MEDICAID    PT Start Time  1100    PT Stop Time  1134    PT Time Calculation (min)  34 min    Activity Tolerance  Patient tolerated treatment well    Behavior During Therapy  [facility] for tasks assessed/performed       Past Medical History:  Diagnosis Date  . Back pain   . Depression   . HA (headache)     Past Surgical History:  Procedure Laterality Date  . None      There were no vitals filed for this visit.  Subjective Assessment - 11/09/18 1105    Subjective  It hurts but not as bad. Upper, right back still hurts after pulling back on Friday- not as bad today. I sat on the edge of my bed for a few hours while studying, my feet weren't touching the ground, I could then feel the strain in my legs and knee.    Patient Stated Goals  decrease pain    Currently in Pain?  Yes    Pain Score  3     Pain Location  Knee    Pain Orientation  Right    Pain Descriptors / Indicators  Patsi Sears PT Assessment - 11/09/18 0001      Assessment   Medical Diagnosis  right knee pain    Referring Provider (PT)  Ophelia Charter, MD    Onset Date/Surgical Date  09/25/17    Hand Dominance  Right    Next MD Visit  PRN      Precautions   Precautions  None      Restrictions   Weight Bearing Restrictions  No      Home Environment   Living Environment  Private residence    Additional Comments  steps to enter home      Prior Function   Vocation  Full time employment    Vocation Requirements  CNA      Cognition   Overall Cognitive  Status  Within Functional Limits for tasks assessed      Strength   Right Hip Flexion  5/5    Right Hip Extension  5/5    Right Hip ABduction  5/5    Left Hip Flexion  5/5    Left Hip Extension  5/5    Left Hip ABduction  5/5    Right Knee Flexion  4+/5    Right Knee Extension  5/5    Left Knee Flexion  4+/5    Left Knee Extension  5/5                           PT Education - 11/09/18 2055    Education Details  goals, POC, insurance auth    Person(s) Educated  Patient    Methods  Explanation    Comprehension  Verbalized understanding       PT Short Term Goals - 11/09/18 1112  PT SHORT TERM GOAL #1   Title  pt will be independent with HEP as it has been established in the short term    Status  Achieved      PT SHORT TERM GOAL #2   Title  pt will be aware to correct knee posture in resting stance    Status  Achieved      PT SHORT TERM GOAL #3   Title  able to navigate stairs step over step with mild discomfort    Baseline  mild strain    Status  Achieved        PT Long Term Goals - 11/09/18 1114      PT LONG TERM GOAL #1   Title  Pt be able to correct full biomechanical chain posture in all positions    Baseline  able to correct knees but feels that she is not sure of all parts at once    Time  5    Period  Weeks    Status  New    Target Date  12/14/18      PT LONG TERM GOAL #2   Title  pt will verbalize improvement in quality of life by reducing pain    Baseline  feels like quality of life is poor due to limitations by pain    Time  5    Period  Weeks    Status  New    Target Date  12/14/18      PT LONG TERM GOAL #3   Title  will be able to exercise wihtout limitaitons by knee pain    Baseline  pain with certain things    Time  5    Period  Weeks    Status  New    Target Date  12/14/18      PT LONG TERM GOAL #4   Title  pt will be able to lay down and sleep without limitations by knee pain    Baseline  significantly limited  and bil legs strain when trying to sleep    Time  5    Period  Weeks    Status  New    Target Date  12/14/18            Plan - 11/09/18 1126    Clinical Impression Statement  Pt has met her short term goals and this time and will continue to benefit from skilled PT to progress toward LTGs. She continues to have bil LE numbness when laying on either side that was mildly assisted by placing a blanket bw her knees. She also reports "heaviness" in both LEs upon standing but does not feel increased heaviness with walking. Due to continuation of these symptoms along-side improved strength, I recommended discussing referral to vascular or neuro with PCP.    PT Treatment/Interventions  ADLs/Self Care Home Management;Cryotherapy;Electrical Stimulation;Ultrasound;Moist Heat;Iontophoresis 22m/ml Dexamethasone;Gait training;Stair training;Functional mobility training;Therapeutic activities;Therapeutic exercise;Patient/family education;Neuromuscular re-education;Manual techniques;Passive range of motion;Dry needling;Taping    PT Next Visit Plan  core stability, review HEP for form, referrals?    PT Home Exercise Plan  seated hamstring curl, SLS with knee bent, stand>sit slow with ball bw knees; post pelvic tilt- with march, with SLR; plank, mini squat with side stepping    Consulted and Agree with Plan of Care  Patient       Patient will benefit from skilled therapeutic intervention in order to improve the following deficits and impairments:  Difficulty walking, Increased muscle spasms, Decreased activity tolerance,  Pain, Improper body mechanics, Decreased strength, Postural dysfunction  Visit Diagnosis: Chronic pain of right knee - Plan: PT plan of care cert/re-cert  Difficulty in walking, not elsewhere classified - Plan: PT plan of care cert/re-cert     Problem List Patient Active Problem List   Diagnosis Date Noted  . Migraine, chronic, without aura 01/26/2017  . Moderate major depression,  single episode ([place]) 11/15/2016    Class: Acute  . Low back pain 12/02/2014  . Chronic migraine without aura or status migrainosus 09/29/2012   Heriberto Stmartin C. Kasyn Rolph PT, [name] 11/09/18 9:04 PM   [facility] [address] [place], [place], [address] Phone: [phone]   Fax:  [phone]  Name: Dajae Kizer MRN: [id number] Date of Birth: [birth year]

## 2018-11-14 ENCOUNTER — Ambulatory Visit: Payer: Medicaid Other | Admitting: Physical Therapy

## 2018-11-16 ENCOUNTER — Ambulatory Visit: Payer: Medicaid Other | Admitting: Physical Therapy

## 2018-11-21 ENCOUNTER — Other Ambulatory Visit: Payer: Self-pay

## 2018-11-21 ENCOUNTER — Ambulatory Visit: Payer: Medicaid Other | Admitting: Physical Therapy

## 2018-11-21 ENCOUNTER — Encounter: Payer: Self-pay | Admitting: Physical Therapy

## 2018-11-21 DIAGNOSIS — G8929 Other chronic pain: Secondary | ICD-10-CM

## 2018-11-21 DIAGNOSIS — R262 Difficulty in walking, not elsewhere classified: Secondary | ICD-10-CM

## 2018-11-21 DIAGNOSIS — M25561 Pain in right knee: Secondary | ICD-10-CM | POA: Diagnosis not present

## 2018-11-21 NOTE — Therapy (Signed)
[facility] [address] [place], [place], [address] Phone: [phone]   Fax:  [phone]  Physical Therapy Treatment  Patient Details  Name: Abigail Henry MRN: [id number] Date of Birth: July 02, 1980 Referring Provider (PT): Ramond Marrow, MD   Encounter Date: 11/21/2018  PT End of Session - 11/21/18 1108    Visit Number  6    Number of Visits  12    Date for PT Re-Evaluation  12/14/18    Authorization - Visit Number  2    Authorization - Number of Visits  8    PT Start Time  1102    PT Stop Time  1143    PT Time Calculation (min)  41 min    Activity Tolerance  Patient tolerated treatment well    Behavior During Therapy  [facility] for tasks assessed/performed       Past Medical History:  Diagnosis Date  . Back pain   . Depression   . HA (headache)     Past Surgical History:  Procedure Laterality Date  . None      There were no vitals filed for this visit.  Subjective Assessment - 11/21/18 1106    Subjective  Patient reports no pain today. She has been using a weighted blanket at night which has helped her numbness.    Currently in Pain?  No/denies                       [facility] Adult PT Treatment/Exercise - 11/21/18 0001      Lumbar Exercises: Stretches   Active Hamstring Stretch  3 reps;20 seconds    Figure 4 Stretch  2 reps;30 seconds      Lumbar Exercises: Machines for Strengthening   Other Lumbar Machine Exercise  Pallof press x15 4.5kg     Other Lumbar Machine Exercise  chops 2x15 bilateral 4.5kg       Lumbar Exercises: Supine   Other Supine Lumbar Exercises  SLR x15 with abdoominal breathing       Lumbar Exercises: Prone   Other Prone Lumbar Exercises  plank 3x15 sec hold       Lumbar Exercises: Quadruped   Opposite Arm/Leg Raise  5 reps    Opposite Arm/Leg Raise Limitations  2x5       Manual Therapy   Manual Traction  LAD 3x30 sec hold each leg              PT Education -  11/21/18 1107    Education Details  reviewed HEP and symptom mangement    Person(s) Educated  Patient    Methods  Explanation;Demonstration;Tactile cues;Verbal cues    Comprehension  Verbalized understanding;Returned demonstration;Verbal cues required;Tactile cues required       PT Short Term Goals - 11/09/18 1112      PT SHORT TERM GOAL #1   Title  pt will be independent with HEP as it has been established in the short term    Status  Achieved      PT SHORT TERM GOAL #2   Title  pt will be aware to correct knee posture in resting stance    Status  Achieved      PT SHORT TERM GOAL #3   Title  able to navigate stairs step over step with mild discomfort    Baseline  mild strain    Status  Achieved        PT Long Term Goals - 11/09/18 1114  PT LONG TERM GOAL #1   Title  Pt be able to correct full biomechanical chain posture in all positions    Baseline  able to correct knees but feels that she is not sure of all parts at once    Time  5    Period  Weeks    Status  New    Target Date  12/14/18      PT LONG TERM GOAL #2   Title  pt will verbalize improvement in quality of life by reducing pain    Baseline  feels like quality of life is poor due to limitations by pain    Time  5    Period  Weeks    Status  New    Target Date  12/14/18      PT LONG TERM GOAL #3   Title  will be able to exercise wihtout limitaitons by knee pain    Baseline  pain with certain things    Time  5    Period  Weeks    Status  New    Target Date  12/14/18      PT LONG TERM GOAL #4   Title  pt will be able to lay down and sleep without limitations by knee pain    Baseline  significantly limited and bil legs strain when trying to sleep    Time  5    Period  Weeks    Status  New    Target Date  12/14/18            Plan - 11/21/18 1411    Clinical Impression Statement  Patient is making good progress. Therapy reviewed quadruped exercises and planks with the patient. She had no  increase in pain. Therapy will continue to advance ther-ex as tolerated.    Personal Factors and Comorbidities  Past/Current Experience;Comorbidity 1    Comorbidities  depression    Examination-Activity Limitations  Bed Mobility;Locomotion Level;Bend;Sit;Carry;Sleep;Squat;Stairs;Stand;Hygiene/Grooming;Lift    Examination-Participation Restrictions  Meal Prep;Cleaning;Community Activity;Shop;Laundry    Stability/Clinical Decision Making  Stable/Uncomplicated    Clinical Decision Making  Low    Rehab Potential  Good    PT Frequency  2x / week    PT Duration  8 weeks    PT Treatment/Interventions  ADLs/Self Care Home Management;Cryotherapy;Electrical Stimulation;Ultrasound;Moist Heat;Iontophoresis 4mg /ml Dexamethasone;Gait training;Stair training;Functional mobility training;Therapeutic activities;Therapeutic exercise;Patient/family education;Neuromuscular re-education;Manual techniques;Passive range of motion;Dry needling;Taping    PT Next Visit Plan  core stability, review HEP for form, referrals?    PT Home Exercise Plan  seated hamstring curl, SLS with knee bent, stand>sit slow with ball bw knees; post pelvic tilt- with march, with SLR; plank, mini squat with side stepping    Consulted and Agree with Plan of Care  Patient       Patient will benefit from skilled therapeutic intervention in order to improve the following deficits and impairments:  Difficulty walking, Increased muscle spasms, Decreased activity tolerance, Pain, Improper body mechanics, Decreased strength, Postural dysfunction  Visit Diagnosis: Chronic pain of right knee  Difficulty in walking, not elsewhere classified     Problem List Patient Active Problem List   Diagnosis Date Noted  . Migraine, chronic, without aura 01/26/2017  . Moderate major depression, single episode (HCC) 11/15/2016    Class: Acute  . Low back pain 12/02/2014  . Chronic migraine without aura or status migrainosus 09/29/2012    Dessie Comaavid J  Jameria Bradway  PT [name]  11/21/2018, 2:14 PM  [facility]  [place], [place], [address] Phone: [phone]   Fax:  [phone]  Name: Abigail Henry MRN: [id number] Date of Birth: [birth year]

## 2018-11-23 ENCOUNTER — Ambulatory Visit: Payer: Medicaid Other | Admitting: Physical Therapy

## 2018-11-28 ENCOUNTER — Other Ambulatory Visit: Payer: Self-pay

## 2018-11-28 ENCOUNTER — Ambulatory Visit: Payer: Medicaid Other | Attending: Orthopaedic Surgery | Admitting: Physical Therapy

## 2018-11-28 ENCOUNTER — Encounter: Payer: Self-pay | Admitting: Physical Therapy

## 2018-11-28 DIAGNOSIS — R262 Difficulty in walking, not elsewhere classified: Secondary | ICD-10-CM | POA: Insufficient documentation

## 2018-11-28 DIAGNOSIS — G8929 Other chronic pain: Secondary | ICD-10-CM

## 2018-11-28 DIAGNOSIS — M25561 Pain in right knee: Secondary | ICD-10-CM | POA: Insufficient documentation

## 2018-11-28 NOTE — Therapy (Signed)
[facility] Outpatient Rehabilitation [facility] [address] [place], [place], [address] Phone: [phone]   Fax:  [phone]  Physical Therapy Treatment  Patient Details  Name: Abigail Henry MRN: [id number] Date of Birth: 07/24/1980 Referring Provider (Henry): Ramond Marrow, MD   Encounter Date: 11/28/2018  Henry End of Session - 11/28/18 1420    Visit Number  7    Number of Visits  12    Date for Henry Re-Evaluation  12/14/18    Authorization Type  Med pay/MEDICAID    Authorization Time Period  Authroization from 10/6 visits    Authorization - Visit Number  3    Authorization - Number of Visits  8    Henry Start Time  1417    Henry Stop Time  1451    Henry Time Calculation (min)  34 min    Activity Tolerance  Patient tolerated treatment well    Behavior During Therapy  [facility] for tasks assessed/performed       Past Medical History:  Diagnosis Date  . Back pain   . Depression   . HA (headache)     Past Surgical History:  Procedure Laterality Date  . None      There were no vitals filed for this visit.  Subjective Assessment - 11/28/18 1420    Subjective  Pain is off and on, it is burning today.                       [facility] Adult Henry Treatment/Exercise - 11/28/18 0001      Lumbar Exercises: Standing   Other Standing Lumbar Exercises  antirotation- SLS & in squat    Other Standing Lumbar Exercises  resisted walking FM 20lb      Lumbar Exercises: Seated   Other Seated Lumbar Exercises  marching on physioball      Lumbar Exercises: Supine   Pelvic Tilt Limitations  posterior- with marching    Dead Bug Limitations  with press on red physioball    Bridge Limitations  with UE press physioball into thighs      Lumbar Exercises: Quadruped   Plank  knees & elbows on physioball    Other Quadruped Lumbar Exercises  physioball rollout, bird dog, alt hip ext hands on physiobal               Henry Short Term Goals - 11/09/18 1112      Henry SHORT TERM  GOAL #1   Title  Henry will be independent with HEP as it has been established in the short term    Status  Achieved      Henry SHORT TERM GOAL #2   Title  Henry will be aware to correct knee posture in resting stance    Status  Achieved      Henry SHORT TERM GOAL #3   Title  able to navigate stairs step over step with mild discomfort    Baseline  mild strain    Status  Achieved        Henry Long Term Goals - 11/09/18 1114      Henry LONG TERM GOAL #1   Title  Henry be able to correct full biomechanical chain posture in all positions    Baseline  able to correct knees but feels that she is not sure of all parts at once    Time  5    Period  Weeks    Status  New    Target  Date  12/14/18      Henry LONG TERM GOAL #2   Title  Henry will verbalize improvement in quality of life by reducing pain    Baseline  feels like quality of life is poor due to limitations by pain    Time  5    Period  Weeks    Status  New    Target Date  12/14/18      Henry LONG TERM GOAL #3   Title  will be able to exercise wihtout limitaitons by knee pain    Baseline  pain with certain things    Time  5    Period  Weeks    Status  New    Target Date  12/14/18      Henry LONG TERM GOAL #4   Title  Henry will be able to lay down and sleep without limitations by knee pain    Baseline  significantly limited and bil legs strain when trying to sleep    Time  5    Period  Weeks    Status  New    Target Date  12/14/18            Plan - 11/28/18 1453    Clinical Impression Statement  Corrected squat form in antirotation. Provided options for strengthening using physioball which were difficult but not painful. Improved awareness of genu valgum and will benefit from further core strengthening for proximal stability.    Henry Treatment/Interventions  ADLs/Self Care Home Management;Cryotherapy;Electrical Stimulation;Ultrasound;Moist Heat;Iontophoresis 4mg /ml Dexamethasone;Gait training;Stair training;Functional mobility  training;Therapeutic activities;Therapeutic exercise;Patient/family education;Neuromuscular re-education;Manual techniques;Passive range of motion;Dry needling;Taping    Henry Next Visit Plan  check hip abd strength    Henry Home Exercise Plan  seated hamstring curl, SLS with knee bent, stand>sit slow with ball bw knees; post pelvic tilt- with march, with SLR; plank, mini squat with side stepping    Consulted and Agree with Plan of Care  Patient       Patient will benefit from skilled therapeutic intervention in order to improve the following deficits and impairments:  Difficulty walking, Increased muscle spasms, Decreased activity tolerance, Pain, Improper body mechanics, Decreased strength, Postural dysfunction  Visit Diagnosis: Chronic pain of right knee  Difficulty in walking, not elsewhere classified     Problem List Patient Active Problem List   Diagnosis Date Noted  . Migraine, chronic, without aura 01/26/2017  . Moderate major depression, single episode ([place]) 11/15/2016    Class: Acute  . Low back pain 12/02/2014  . Chronic migraine without aura or status migrainosus 09/29/2012  Abigail Markus C. Abigail Henry, [name] 11/28/18 2:57 PM   [facility] [address] [place], [place], [address] Phone: [phone]   Fax:  [phone]  Name: Abigail Henry MRN: [id number] Date of Birth: 1980-06-19

## 2018-11-30 ENCOUNTER — Other Ambulatory Visit: Payer: Self-pay

## 2018-11-30 ENCOUNTER — Ambulatory Visit: Payer: Medicaid Other | Admitting: Physical Therapy

## 2018-11-30 ENCOUNTER — Encounter: Payer: Self-pay | Admitting: Physical Therapy

## 2018-11-30 DIAGNOSIS — R262 Difficulty in walking, not elsewhere classified: Secondary | ICD-10-CM

## 2018-11-30 DIAGNOSIS — M25561 Pain in right knee: Secondary | ICD-10-CM

## 2018-11-30 DIAGNOSIS — G8929 Other chronic pain: Secondary | ICD-10-CM

## 2018-11-30 NOTE — Therapy (Signed)
[place], [place], [address] Phone: [phone]   Fax:  [phone]  Physical Therapy Treatment  Patient Details  Name: Abigail Henry MRN: [id number] Date of Birth: 10/24/1980 Referring Provider (PT): Ophelia Charter, MD   Encounter Date: 11/30/2018  PT End of Session - 11/30/18 1024    Visit Number  8    Number of Visits  12    Date for PT Re-Evaluation  12/14/18    Authorization Type  Med pay/MEDICAID    Authorization Time Period  Authroization from 10/6 visits    Authorization - Visit Number  4    Authorization - Number of Visits  8    PT Start Time  1021    PT Stop Time  1057    PT Time Calculation (min)  36 min    Activity Tolerance  Patient tolerated treatment well    Behavior During Therapy  [facility] for tasks assessed/performed       Past Medical History:  Diagnosis Date  . Back pain   . Depression   . HA (headache)     Past Surgical History:  Procedure Laterality Date  . None      There were no vitals filed for this visit.  Subjective Assessment - 11/30/18 1023    Subjective  Knee started hurting yesterday and hip this morning.    Currently in Pain?  Yes    Pain Score  6     Pain Location  Hip    Pain Orientation  Right    Pain Radiating Towards  Rt knee         [facility] PT Assessment - 11/30/18 0001      Ambulation/Gait   Gait Comments  +Rt trendelenburg                   [facility] Adult PT Treatment/Exercise - 11/30/18 0001      Lumbar Exercises: Stretches   Figure 4 Stretch Limitations  seated EOB      Lumbar Exercises: Standing   Functional Squats Limitations  ball bw knees- cues to use gluts    Other Standing Lumbar Exercises  Lt hip hike    Other Standing Lumbar Exercises  stepping with Rt hip engagement      Lumbar Exercises: Supine   Bridge Limitations  feet flexed with ball bw knees      Lumbar Exercises: Sidelying   Clam  Right;20 reps    Clam Limitations   red tband at knees    Other Sidelying Lumbar Exercises  sidelying hip circles      Manual Therapy   Manual Therapy  Muscle Energy Technique;Joint mobilization    Joint Mobilization  Lt SIJ in prone    Soft tissue mobilization  Lt quads    Muscle Energy Technique  Lt hip flexors/Rt extensors with iso abd/add               PT Short Term Goals - 11/09/18 1112      PT SHORT TERM GOAL #1   Title  pt will be independent with HEP as it has been established in the short term    Status  Achieved      PT SHORT TERM GOAL #2   Title  pt will be aware to correct knee posture in resting stance    Status  Achieved      PT SHORT TERM GOAL #3   Title  able to navigate stairs step over  step with mild discomfort    Baseline  mild strain    Status  Achieved        PT Long Term Goals - 11/09/18 1114      PT LONG TERM GOAL #1   Title  Pt be able to correct full biomechanical chain posture in all positions    Baseline  able to correct knees but feels that she is not sure of all parts at once    Time  5    Period  Weeks    Status  New    Target Date  12/14/18      PT LONG TERM GOAL #2   Title  pt will verbalize improvement in quality of life by reducing pain    Baseline  feels like quality of life is poor due to limitations by pain    Time  5    Period  Weeks    Status  New    Target Date  12/14/18      PT LONG TERM GOAL #3   Title  will be able to exercise wihtout limitaitons by knee pain    Baseline  pain with certain things    Time  5    Period  Weeks    Status  New    Target Date  12/14/18      PT LONG TERM GOAL #4   Title  pt will be able to lay down and sleep without limitations by knee pain    Baseline  significantly limited and bil legs strain when trying to sleep    Time  5    Period  Weeks    Status  New    Target Date  12/14/18            Plan - 11/30/18 1100    Clinical Impression Statement  Presented with a pelvic rotation that was corrected with  MET. Poor activation of Rt hip abductors created trendelenburg and strain was corrected with cuing. Pt reported feeling better following treatment.    PT Treatment/Interventions  ADLs/Self Care Home Management;Cryotherapy;Electrical Stimulation;Ultrasound;Moist Heat;Iontophoresis 47m/ml Dexamethasone;Gait training;Stair training;Functional mobility training;Therapeutic activities;Therapeutic exercise;Patient/family education;Neuromuscular re-education;Manual techniques;Passive range of motion;Dry needling;Taping    PT Next Visit Plan  check pelvic rotation & stability    PT Home Exercise Plan  seated hamstring curl, SLS with knee bent, stand>sit slow with ball bw knees; post pelvic tilt- with march, with SLR; plank, mini squat with side stepping, hip hike    Consulted and Agree with Plan of Care  Patient       Patient will benefit from skilled therapeutic intervention in order to improve the following deficits and impairments:  Difficulty walking, Increased muscle spasms, Decreased activity tolerance, Pain, Improper body mechanics, Decreased strength, Postural dysfunction  Visit Diagnosis: Chronic pain of right knee  Difficulty in walking, not elsewhere classified     Problem List Patient Active Problem List   Diagnosis Date Noted  . Migraine, chronic, without aura 01/26/2017  . Moderate major [place] 11/15/2016    Class: Acute  . Low back pain 12/02/2014  . Chronic migraine without aura or status migrainosus 09/29/2012    Abigail Henry PT, [name] 11/30/18 11:02 AM   [facility] [address] [address]Phone: [phone]  Fax:  [phone] Name: Abigail HalabiMRN: [id number] of Birth: [birth year]

## 2018-12-05 ENCOUNTER — Other Ambulatory Visit: Payer: Self-pay

## 2018-12-05 ENCOUNTER — Encounter: Payer: Self-pay | Admitting: Physical Therapy

## 2018-12-05 ENCOUNTER — Ambulatory Visit: Payer: Medicaid Other | Admitting: Physical Therapy

## 2018-12-05 DIAGNOSIS — R262 Difficulty in walking, not elsewhere classified: Secondary | ICD-10-CM

## 2018-12-05 DIAGNOSIS — G8929 Other chronic pain: Secondary | ICD-10-CM

## 2018-12-05 DIAGNOSIS — M25561 Pain in right knee: Secondary | ICD-10-CM

## 2018-12-05 IMAGING — DX DG CHEST 2V
2 series · 2 of 2 positions shown · non-contrast
Comparison: No recent prior .

CLINICAL DATA: Cough.

EXAM:
CHEST  2 VIEW

[w chest pa]
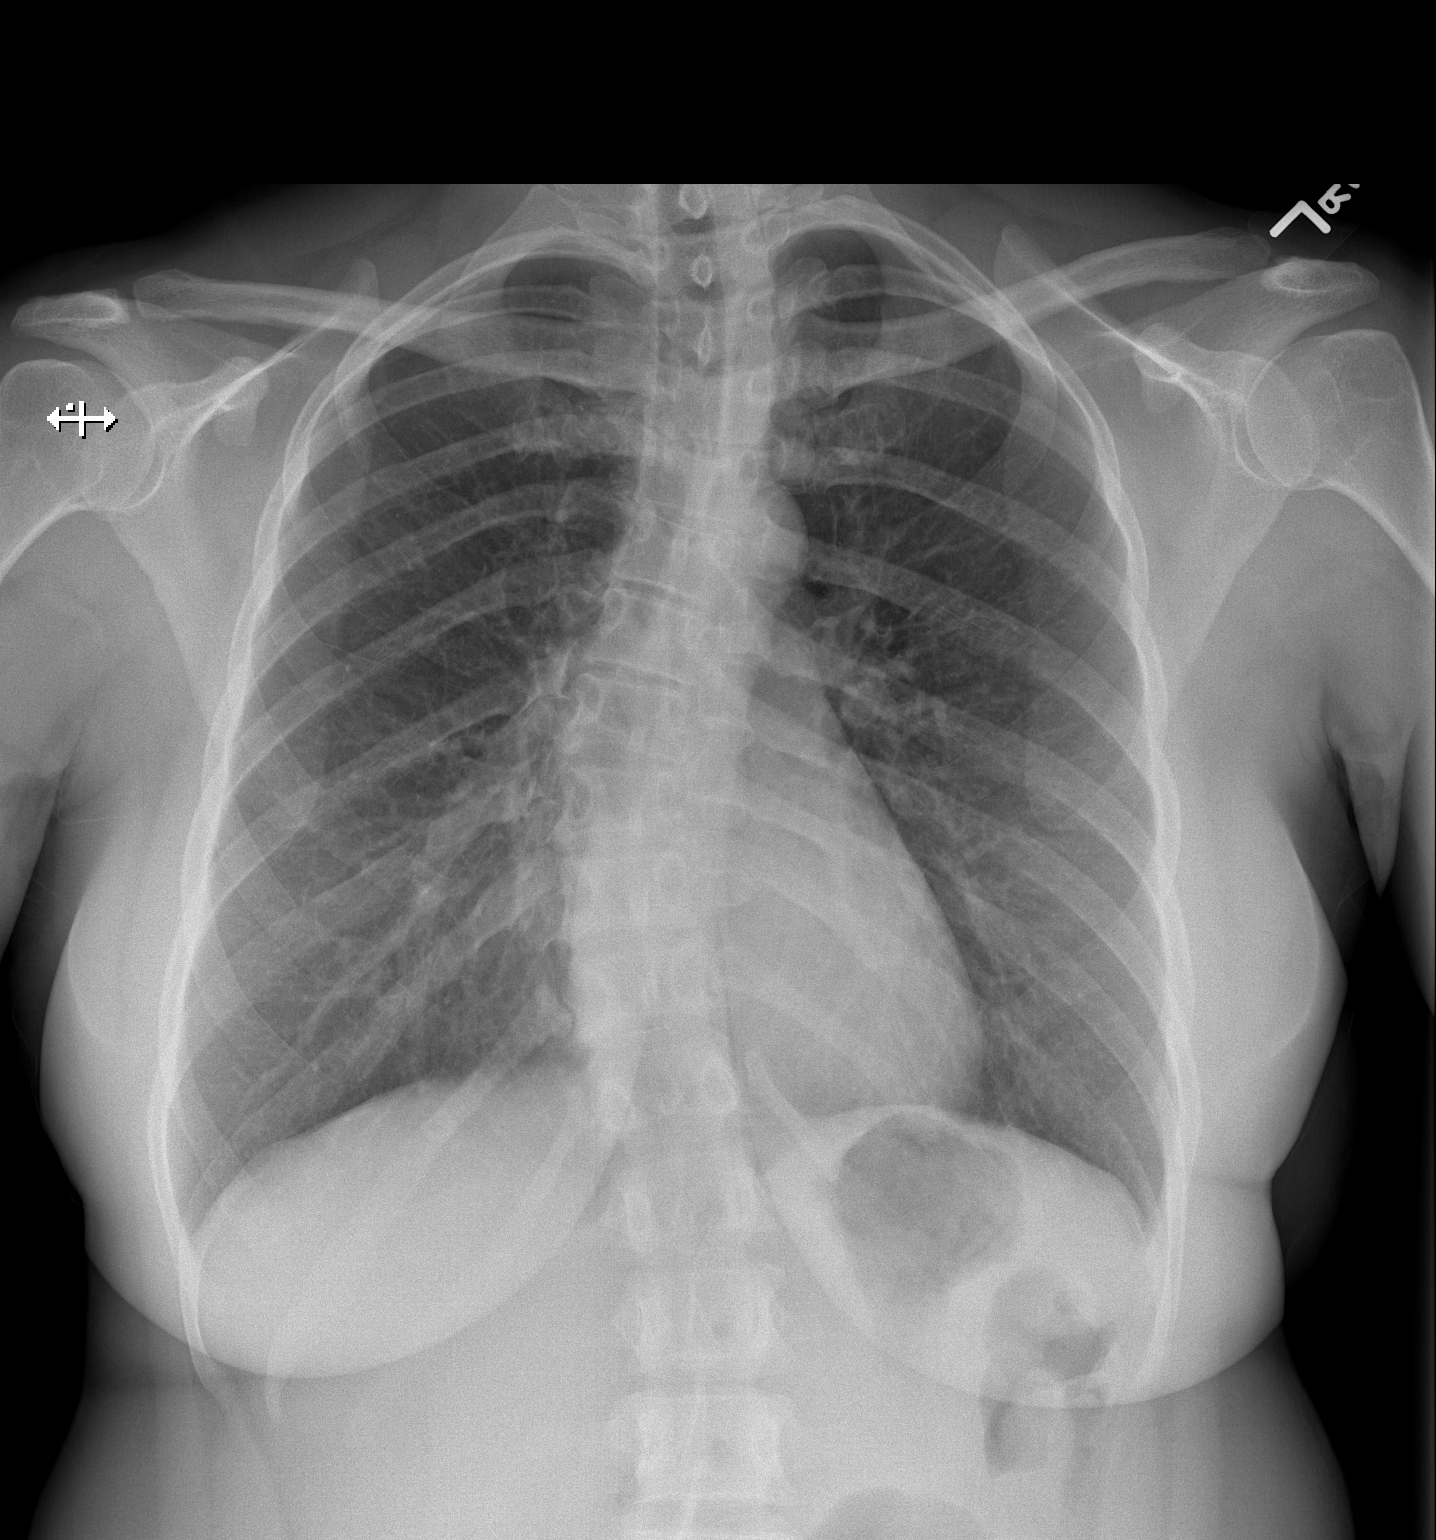

[w chest lat]
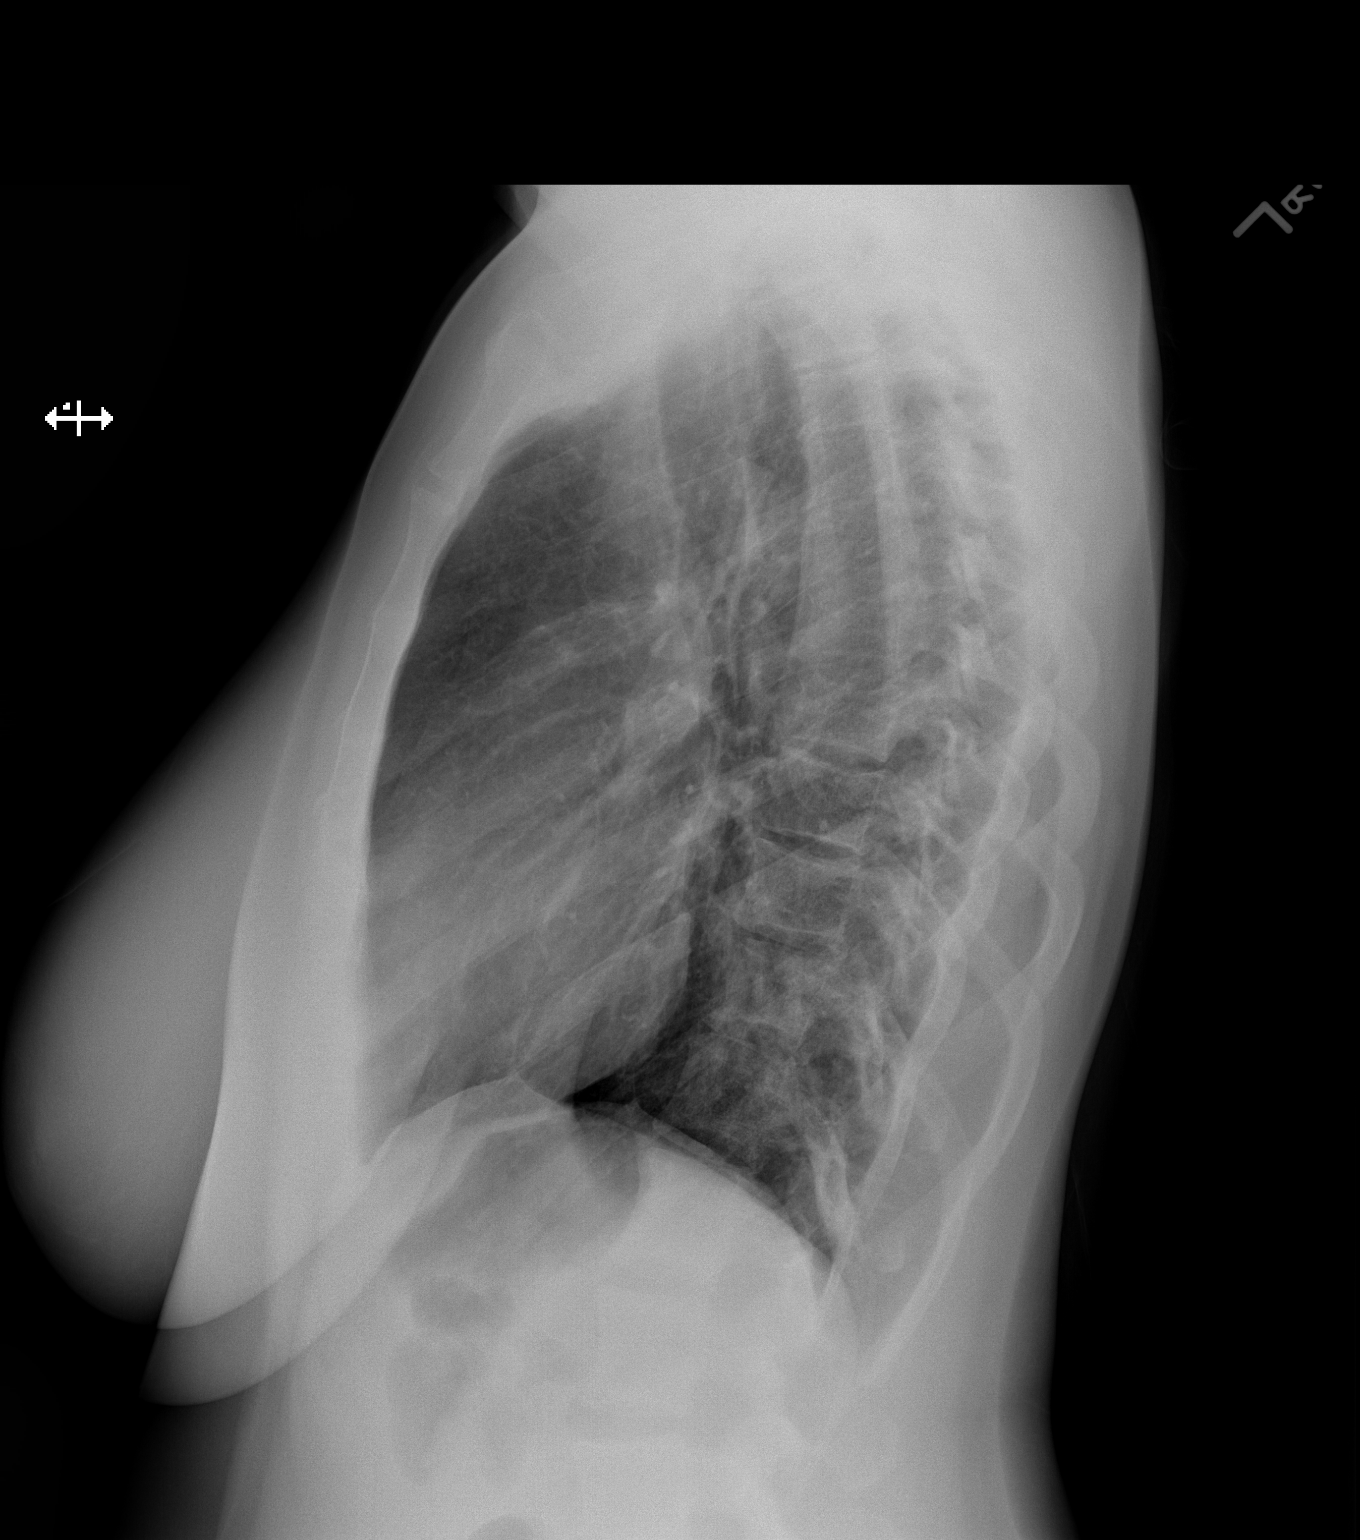

[2 of 2 positions shown; findings below may reference images not displayed]

FINDINGS: Mediastinum and hilar structures normal. Lungs are clear. No pleural
effusion or pneumothorax. Heart size normal. Thoracic spine
scoliosis. No acute bony abnormality .
IMPRESSION: No acute abnormality .

## 2018-12-05 NOTE — Therapy (Signed)
[facility] Outpatient Rehabilitation [facility] [address] [place], [place], [address] Phone: [phone]   Fax:  [phone]  Physical Therapy Treatment  Patient Details  Name: Kare Dado MRN: [id number] Date of Birth: 1981-01-10 Referring Provider (PT): Ramond Marrow, MD   Encounter Date: 12/05/2018  PT End of Session - 12/05/18 0931    Visit Number  9    Number of Visits  12    Date for PT Re-Evaluation  12/14/18    Authorization Type  Med pay/MEDICAID    Authorization Time Period  Authroization from 10/6 visits    Authorization - Visit Number  5    Authorization - Number of Visits  8    PT Start Time  0932    PT Stop Time  1008    PT Time Calculation (min)  36 min    Activity Tolerance  Patient tolerated treatment well    Behavior During Therapy  [facility] for tasks assessed/performed       Past Medical History:  Diagnosis Date  . Back pain   . Depression   . HA (headache)     Past Surgical History:  Procedure Laterality Date  . None      There were no vitals filed for this visit.                    [facility] Adult PT Treatment/Exercise - 12/05/18 0001      Lumbar Exercises: Standing   Functional Squats Limitations  hands on back of chair, tap table    Other Standing Lumbar Exercises  SLS on Lt leg; SLS + Rt knee drives      Lumbar Exercises: Supine   Bridge with March  10 reps   lift Rt leg     Lumbar Exercises: Sidelying   Other Sidelying Lumbar Exercises  sidelying hip circles      Manual Therapy   Muscle Energy Technique  Lt hip flexors/Rt extensors with iso abd/add               PT Short Term Goals - 11/09/18 1112      PT SHORT TERM GOAL #1   Title  pt will be independent with HEP as it has been established in the short term    Status  Achieved      PT SHORT TERM GOAL #2   Title  pt will be aware to correct knee posture in resting stance    Status  Achieved      PT SHORT TERM GOAL #3   Title  able to  navigate stairs step over step with mild discomfort    Baseline  mild strain    Status  Achieved        PT Long Term Goals - 11/09/18 1114      PT LONG TERM GOAL #1   Title  Pt be able to correct full biomechanical chain posture in all positions    Baseline  able to correct knees but feels that she is not sure of all parts at once    Time  5    Period  Weeks    Status  New    Target Date  12/14/18      PT LONG TERM GOAL #2   Title  pt will verbalize improvement in quality of life by reducing pain    Baseline  feels like quality of life is poor due to limitations by pain    Time  5  Period  Weeks    Status  New    Target Date  12/14/18      PT LONG TERM GOAL #3   Title  will be able to exercise wihtout limitaitons by knee pain    Baseline  pain with certain things    Time  5    Period  Weeks    Status  New    Target Date  12/14/18      PT LONG TERM GOAL #4   Title  pt will be able to lay down and sleep without limitations by knee pain    Baseline  significantly limited and bil legs strain when trying to sleep    Time  5    Period  Weeks    Status  New    Target Date  12/14/18            Plan - 12/05/18 0950    Clinical Impression Statement  2 layer lift in Rt shoe decreased knee pain but increased notable Lt trendelenburg. Asked pt to wear lift for 48 hrs unless concordant pain increases.    PT Treatment/Interventions  ADLs/Self Care Home Management;Cryotherapy;Electrical Stimulation;Ultrasound;Moist Heat;Iontophoresis 4mg /ml Dexamethasone;Gait training;Stair training;Functional mobility training;Therapeutic activities;Therapeutic exercise;Patient/family education;Neuromuscular re-education;Manual techniques;Passive range of motion;Dry needling;Taping    PT Next Visit Plan  outcome of lift? increase core stability training    PT Home Exercise Plan  seated hamstring curl, SLS with knee bent, stand>sit slow with ball bw knees; post pelvic tilt- with march, with SLR;  plank, mini squat with side stepping, hip hike, seated HSS, squat chair taps    Consulted and Agree with Plan of Care  Patient       Patient will benefit from skilled therapeutic intervention in order to improve the following deficits and impairments:  Difficulty walking, Increased muscle spasms, Decreased activity tolerance, Pain, Improper body mechanics, Decreased strength, Postural dysfunction  Visit Diagnosis: Chronic pain of right knee  Difficulty in walking, not elsewhere classified     Problem List Patient Active Problem List   Diagnosis Date Noted  . Migraine, chronic, without aura 01/26/2017  . Moderate major depression, single episode ([place]) 11/15/2016    Class: Acute  . Low back pain 12/02/2014  . Chronic migraine without aura or status migrainosus 09/29/2012    Briella Hobday C. Nahara Dona PT, [name] 12/05/18 10:12 AM  [facility] [address] [place], [place], [address] Phone: [phone]   Fax:  [phone]  Name: Elenor Wildes MRN: [id number] Date of Birth: 1980-07-04

## 2018-12-07 ENCOUNTER — Encounter: Payer: Self-pay | Admitting: Physical Therapy

## 2018-12-07 ENCOUNTER — Ambulatory Visit: Payer: Medicaid Other | Admitting: Physical Therapy

## 2018-12-07 ENCOUNTER — Other Ambulatory Visit: Payer: Self-pay

## 2018-12-07 DIAGNOSIS — R262 Difficulty in walking, not elsewhere classified: Secondary | ICD-10-CM

## 2018-12-07 DIAGNOSIS — M25561 Pain in right knee: Secondary | ICD-10-CM | POA: Diagnosis not present

## 2018-12-07 DIAGNOSIS — G8929 Other chronic pain: Secondary | ICD-10-CM

## 2018-12-07 NOTE — Therapy (Signed)
[facility] Outpatient Rehabilitation [facility] [address] [place], [place], [address] Phone: [phone]   Fax:  [phone]  Physical Therapy Treatment  Patient Details  Name: Abigail Henry MRN: [id number] Date of Birth: 1980/03/23 Referring Provider (PT): Ramond Marrow, MD   Encounter Date: 12/07/2018  PT End of Session - 12/07/18 1551    Visit Number  10    Number of Visits  12    Date for PT Re-Evaluation  12/14/18    Authorization Type  Med pay/MEDICAID    Authorization Time Period  Authroization from 10/6 visits    Authorization - Visit Number  6    Authorization - Number of Visits  8    PT Start Time  1548    PT Stop Time  1626    PT Time Calculation (min)  38 min    Activity Tolerance  Patient tolerated treatment well    Behavior During Therapy  [facility] for tasks assessed/performed       Past Medical History:  Diagnosis Date  . Back pain   . Depression   . HA (headache)     Past Surgical History:  Procedure Laterality Date  . None      There were no vitals filed for this visit.  Subjective Assessment - 12/07/18 1553    Subjective  Noticed a positive change in leg with wearing lift. Points to pes ansering when describing a pain like somebody is cutting it. No notable pattern in pain.    Patient Stated Goals  decrease pain    Currently in Pain?  Yes    Pain Score  5    when I feel the cutting pain   Pain Location  Knee    Pain Orientation  Right;Lower;Medial    Aggravating Factors   numbness is not as bad but my knee is irritated when i lay down                       [facility] Adult PT Treatment/Exercise - 12/07/18 0001      Lumbar Exercises: Stretches   Passive Hamstring Stretch  Right;Left;2 reps;30 seconds    Passive Hamstring Stretch Limitations  supine with strap      Lumbar Exercises: Aerobic   Recumbent Bike  5 min L2      Lumbar Exercises: Standing   Functional Squats Limitations  bosu squats, double leg & holds for  stability    Other Standing Lumbar Exercises  upper body rotations & chops- red plyoball in hands extended, balance on bosu      Lumbar Exercises: Supine   Bridge Limitations  in plantar flexion, cues for dissociation    Other Supine Lumbar Exercises  TT toe taps    Other Supine Lumbar Exercises  TT t/o press      Modalities   Modalities  Iontophoresis      Iontophoresis   Type of Iontophoresis  Dexamethasone               PT Short Term Goals - 11/09/18 1112      PT SHORT TERM GOAL #1   Title  pt will be independent with HEP as it has been established in the short term    Status  Achieved      PT SHORT TERM GOAL #2   Title  pt will be aware to correct knee posture in resting stance    Status  Achieved      PT SHORT TERM GOAL #  3   Title  able to navigate stairs step over step with mild discomfort    Baseline  mild strain    Status  Achieved        PT Long Term Goals - 11/09/18 1114      PT LONG TERM GOAL #1   Title  Pt be able to correct full biomechanical chain posture in all positions    Baseline  able to correct knees but feels that she is not sure of all parts at once    Time  5    Period  Weeks    Status  New    Target Date  12/14/18      PT LONG TERM GOAL #2   Title  pt will verbalize improvement in quality of life by reducing pain    Baseline  feels like quality of life is poor due to limitations by pain    Time  5    Period  Weeks    Status  New    Target Date  12/14/18      PT LONG TERM GOAL #3   Title  will be able to exercise wihtout limitaitons by knee pain    Baseline  pain with certain things    Time  5    Period  Weeks    Status  New    Target Date  12/14/18      PT LONG TERM GOAL #4   Title  pt will be able to lay down and sleep without limitations by knee pain    Baseline  significantly limited and bil legs strain when trying to sleep    Time  5    Period  Weeks    Status  New    Target Date  12/14/18            Plan -  12/07/18 1552    Clinical Impression Statement  used bosu to encourage equal weight bearing through LEs. ionto patch placed on Rt pes anserine as bursa was TTP.    PT Treatment/Interventions  ADLs/Self Care Home Management;Cryotherapy;Electrical Stimulation;Ultrasound;Moist Heat;Iontophoresis 4mg /ml Dexamethasone;Gait training;Stair training;Functional mobility training;Therapeutic activities;Therapeutic exercise;Patient/family education;Neuromuscular re-education;Manual techniques;Passive range of motion;Dry needling;Taping    PT Next Visit Plan  outcome of ionto? continue CKC & core strength    PT Home Exercise Plan  seated hamstring curl, SLS with knee bent, stand>sit slow with ball bw knees; post pelvic tilt- with march, with SLR; plank, mini squat with side stepping, hip hike, seated HSS, squat chair taps    Consulted and Agree with Plan of Care  Patient       Patient will benefit from skilled therapeutic intervention in order to improve the following deficits and impairments:  Difficulty walking, Increased muscle spasms, Decreased activity tolerance, Pain, Improper body mechanics, Decreased strength, Postural dysfunction  Visit Diagnosis: Chronic pain of right knee  Difficulty in walking, not elsewhere classified     Problem List Patient Active Problem List   Diagnosis Date Noted  . Migraine, chronic, without aura 01/26/2017  . Moderate major depression, single episode ([place]) 11/15/2016    Class: Acute  . Low back pain 12/02/2014  . Chronic migraine without aura or status migrainosus 09/29/2012    Kamia Insalaco C. Calisa Luckenbaugh PT, [name] 12/07/18 4:33 PM   [facility] [address] [place], [place], [address] Phone: [phone]   Fax:  [phone]  Name: Abigail Henry MRN: [id number] Date of Birth: Feb 25, 1980

## 2018-12-12 ENCOUNTER — Other Ambulatory Visit: Payer: Self-pay

## 2018-12-12 ENCOUNTER — Ambulatory Visit: Payer: Medicaid Other | Admitting: Physical Therapy

## 2018-12-12 DIAGNOSIS — R262 Difficulty in walking, not elsewhere classified: Secondary | ICD-10-CM

## 2018-12-12 DIAGNOSIS — G8929 Other chronic pain: Secondary | ICD-10-CM

## 2018-12-12 DIAGNOSIS — M25561 Pain in right knee: Secondary | ICD-10-CM | POA: Diagnosis not present

## 2018-12-12 NOTE — Therapy (Signed)
[place], [place], [address] Phone: [phone]   Fax:  [phone]  Physical Therapy Treatment  Patient Details  Name: Abigail Henry MRN: [id number] Date of Birth: Nov 08, 1980 Referring Provider (PT): Ophelia Charter, MD   Encounter Date: 12/12/2018  PT End of Session - 12/12/18 0939    Visit Number  11    Number of Visits  12    Date for PT Re-Evaluation  12/14/18    Authorization Type  Med pay/MEDICAID    Authorization Time Period  Authroization from 10/6 visits    Authorization - Visit Number  7    Authorization - Number of Visits  8    PT Start Time  [phone]   pt arrived late and then went to restroom   PT Stop Time  1010    PT Time Calculation (min)  31 min    Activity Tolerance  Patient tolerated treatment well    Behavior During Therapy  [facility] for tasks assessed/performed       Past Medical History:  Diagnosis Date  . Back pain   . Depression   . HA (headache)     Past Surgical History:  Procedure Laterality Date  . None      There were no vitals filed for this visit.  Subjective Assessment - 12/12/18 0939    Subjective  I took the lift out the day after last treatment and my knee started hurting so I put it back in. I had some knee and back pain yesterday (was more active on Saturday in converse with lift) I am fine so far today.    Patient Stated Goals  decrease pain                       [facility] Adult PT Treatment/Exercise - 12/12/18 0001      Lumbar Exercises: Stretches   Passive Hamstring Stretch Limitations  supine with strap      Lumbar Exercises: Supine   Other Supine Lumbar Exercises  scissors, hundred series      Lumbar Exercises: Sidelying   Other Sidelying Lumbar Exercises  hip circles propped on elbow    Other Sidelying Lumbar Exercises  side plank with hip abd      Lumbar Exercises: Quadruped   Other Quadruped Lumbar Exercises  bird do & bird dog crunch     Other Quadruped Lumbar Exercises  primal push up      Iontophoresis   Type of Iontophoresis  Dexamethasone    Location  Rt pes anserine    Dose  1cc    Time  6 hr wear               PT Short Term Goals - 11/09/18 1112      PT SHORT TERM GOAL #1   Title  pt will be independent with HEP as it has been established in the short term    Status  Achieved      PT SHORT TERM GOAL #2   Title  pt will be aware to correct knee posture in resting stance    Status  Achieved      PT SHORT TERM GOAL #3   Title  able to navigate stairs step over step with mild discomfort    Baseline  mild strain    Status  Achieved        PT Long Term Goals - 11/09/18 1114      PT  LONG TERM GOAL #1   Title  Pt be able to correct full biomechanical chain posture in all positions    Baseline  able to correct knees but feels that she is not sure of all parts at once    Time  5    Period  Weeks    Status  New    Target Date  12/14/18      PT LONG TERM GOAL #2   Title  pt will verbalize improvement in quality of life by reducing pain    Baseline  feels like quality of life is poor due to limitations by pain    Time  5    Period  Weeks    Status  New    Target Date  12/14/18      PT LONG TERM GOAL #3   Title  will be able to exercise wihtout limitaitons by knee pain    Baseline  pain with certain things    Time  5    Period  Weeks    Status  New    Target Date  12/14/18      PT LONG TERM GOAL #4   Title  pt will be able to lay down and sleep without limitations by knee pain    Baseline  significantly limited and bil legs strain when trying to sleep    Time  5    Period  Weeks    Status  New    Target Date  12/14/18            Plan - 12/12/18 4193    Clinical Impression Statement  placed ionto again due to continued pain at pes anserine- is decreased from last visit. progressed HEP for core stability. Pt would like to d/c to independent program at next visit and will continue  wearing heel lift for about 6 weeks while she focuses on strengthening and then progress to heel lift wear PRN.    PT Treatment/Interventions  ADLs/Self Care Home Management;Cryotherapy;Electrical Stimulation;Ultrasound;Moist Heat;Iontophoresis 4mg /ml Dexamethasone;Gait training;Stair training;Functional mobility training;Therapeutic activities;Therapeutic exercise;Patient/family education;Neuromuscular re-education;Manual techniques;Passive range of motion;Dry needling;Taping    PT Next Visit Plan  d/c    PT Home Exercise Plan  seated hamstring curl, SLS with knee bent, stand>sit slow with ball bw knees; post pelvic tilt- with march, with SLR; plank, mini squat with side stepping, hip hike, seated HSS, squat chair taps, scissors, hundreds series, primal push up    Consulted and Agree with Plan of Care  Patient       Patient will benefit from skilled therapeutic intervention in order to improve the following deficits and impairments:  Difficulty walking, Increased muscle spasms, Decreased activity tolerance, Pain, Improper body mechanics, Decreased strength, Postural dysfunction  Visit Diagnosis: Chronic pain of right knee  Difficulty in walking, not elsewhere classified     Problem List Patient Active Problem List   Diagnosis Date Noted  . Migraine, chronic, without aura 01/26/2017  . Moderate major depression, single episode (HCC) 11/15/2016    Class: Acute  . Low back pain 12/02/2014  . Chronic migraine without aura or status migrainosus 09/29/2012    Abigail Henry PT, [name] 12/12/18 10:30 AM   [facility] [address] [place], [place], [place] Phone: [phone]   Fax:  [phone]  Name: Abigail Henry MRN: [name] Date of Birth: 11/02/1980

## 2018-12-14 ENCOUNTER — Ambulatory Visit: Payer: Medicaid Other | Admitting: Physical Therapy

## 2018-12-14 ENCOUNTER — Other Ambulatory Visit: Payer: Self-pay

## 2018-12-14 ENCOUNTER — Encounter: Payer: Self-pay | Admitting: Physical Therapy

## 2018-12-14 DIAGNOSIS — M25561 Pain in right knee: Secondary | ICD-10-CM | POA: Diagnosis not present

## 2018-12-14 DIAGNOSIS — G8929 Other chronic pain: Secondary | ICD-10-CM

## 2018-12-14 DIAGNOSIS — R262 Difficulty in walking, not elsewhere classified: Secondary | ICD-10-CM

## 2018-12-14 NOTE — Therapy (Signed)
[place], [place], [address] Phone: [phone]   Fax:  [phone]  Physical Therapy Treatment/Discharge  Patient Details  Name: Abigail Henry MRN: [id number] Date of Birth: [birth year] Referring Provider (PT): Ophelia Charter, MD   Encounter Date: 12/14/2018  PT End of Session - 12/14/18 1552    Visit Number  12    Number of Visits  12    Date for PT Re-Evaluation  12/14/18    Authorization Type  Med pay/MEDICAID    Authorization Time Period  Authroization from 10/6 visits    Authorization - Visit Number  8    Authorization - Number of Visits  8    PT Start Time  1550    PT Stop Time  1613    PT Time Calculation (min)  23 min    Activity Tolerance  Patient tolerated treatment well    Behavior During Therapy  [facility] for tasks assessed/performed       Past Medical History:  Diagnosis Date  . Back pain   . Depression   . HA (headache)     Past Surgical History:  Procedure Laterality Date  . None      There were no vitals filed for this visit.  Subjective Assessment - 12/14/18 1551    Subjective  Yesterday pain down lateral leg. Pes anserine pain is less intense but is irritable to light touch, patch was helpful again. Discomfort and strain when I am not wearing heel lift.         [facility] PT Assessment - 12/14/18 0001      Assessment   Medical Diagnosis  right knee pain    Referring Provider (PT)  Ophelia Charter, MD    Onset Date/Surgical Date  09/25/17    Hand Dominance  Right    Next MD Visit  PRN      Strength   Right Knee Flexion  5/5    Left Knee Flexion  5/5                   [facility] Adult PT Treatment/Exercise - 12/14/18 0001      Iontophoresis   Type of Iontophoresis  Dexamethasone    Location  Rt pes anserine    Dose  1cc    Time  6 hr wear      Manual Therapy   Soft tissue mobilization  roller Rt hip & ITB             PT Education - 12/14/18 1616     Education Details  importance of continued HEP, habitual changes, POC    Person(s) Educated  Patient    Methods  Explanation    Comprehension  Verbalized understanding       PT Short Term Goals - 11/09/18 1112      PT SHORT TERM GOAL #1   Title  pt will be independent with HEP as it has been established in the short term    Status  Achieved      PT SHORT TERM GOAL #2   Title  pt will be aware to correct knee posture in resting stance    Status  Achieved      PT SHORT TERM GOAL #3   Title  able to navigate stairs step over step with mild discomfort    Baseline  mild strain    Status  Achieved        PT Long Term Goals - 12/14/18 1557  PT LONG TERM GOAL #1   Title  Pt be able to correct full biomechanical chain posture in all positions    Status  Achieved      PT LONG TERM GOAL #2   Title  pt will verbalize improvement in quality of life by reducing pain    Status  Achieved      PT LONG TERM GOAL #3   Title  will be able to exercise wihtout limitaitons by knee pain    Baseline  continued discomfort but significantly decreased    Status  Partially Met      PT LONG TERM GOAL #4   Title  pt will be able to lay down and sleep without limitations by knee pain    Status  Achieved            Plan - 12/14/18 1617    Clinical Impression Statement  Pt has made significant progress toward her goals and would like to d/c to independent program at this time to work on HEP and return in the new year for progression. good strength improvements and postural changes. Heel lift creating tightness along ITB and in hip abductors as expected and she does feel a good release in her knee when wearing the lift. Encouraged her to contact us with any further questions and return with new Rx as needed.    PT Treatment/Interventions  ADLs/Self Care Home Management;Cryotherapy;Electrical Stimulation;Ultrasound;Moist Heat;Iontophoresis 58m/ml Dexamethasone;Gait training;Stair  training;Functional mobility training;Therapeutic activities;Therapeutic exercise;Patient/family education;Neuromuscular re-education;Manual techniques;Passive range of motion;Dry needling;Taping    PT Home Exercise Plan  seated hamstring curl, SLS with knee bent, stand>sit slow with ball bw knees; post pelvic tilt- with march, with SLR; plank, mini squat with side stepping, hip hike, seated HSS, squat chair taps, scissors, hundreds series, primal push up    Consulted and Agree with Plan of Care  Patient       Patient will benefit from skilled therapeutic intervention in order to improve the following deficits and impairments:  Difficulty walking, Increased muscle spasms, Decreased activity tolerance, Pain, Improper body mechanics, Decreased strength, Postural dysfunction  Visit Diagnosis: Chronic pain of right knee  Difficulty in walking, not elsewhere classified     Problem List Patient Active Problem List   Diagnosis Date Noted  . Migraine, chronic, without aura 01/26/2017  . Moderate major depression, single episode ([place] 11/15/2016    Class: Acute  . Low back pain 12/02/2014  . Chronic migraine without aura or status migrainosus 09/29/2012   PHYSICAL THERAPY DISCHARGE SUMMARY  Visits from Start of Care: 12  Current functional level related to goals / functional outcomes: See above   Remaining deficits: See above   Education / Equipment: Anatomy of condition, POC, HEP, exercise form/rationale  Plan: Patient agrees to discharge.  Patient goals were met. Patient is being discharged due to being pleased with the current functional level.  ?????     Rosaly Labarbera C. Sreshta Cressler PT, [name] 12/14/18 4:20 PM   [facility] [address]Phone: [phone]  Fax:  [phone] Name: Abigail BergtholdMRN: [id number] of Birth: [birth year]

## 2018-12-30 ENCOUNTER — Other Ambulatory Visit: Payer: Self-pay

## 2018-12-30 DIAGNOSIS — Z20822 Contact with and (suspected) exposure to covid-19: Secondary | ICD-10-CM

## 2019-01-02 LAB — NOVEL CORONAVIRUS, NAA: SARS-CoV-2, NAA: NOT DETECTED

## 2019-03-26 ENCOUNTER — Ambulatory Visit: Payer: No Typology Code available for payment source

## 2022-11-01 ENCOUNTER — Other Ambulatory Visit: Payer: Self-pay | Admitting: Internal Medicine

## 2022-11-01 DIAGNOSIS — Z1231 Encounter for screening mammogram for malignant neoplasm of breast: Secondary | ICD-10-CM

## 2022-11-23 ENCOUNTER — Ambulatory Visit
Admission: RE | Admit: 2022-11-23 | Discharge: 2022-11-23 | Disposition: A | Discharge status: Home / Self Care | Payer: No Typology Code available for payment source | Source: Ambulatory Visit | Attending: Internal Medicine | Admitting: Internal Medicine

## 2022-11-23 DIAGNOSIS — Z1231 Encounter for screening mammogram for malignant neoplasm of breast: Secondary | ICD-10-CM

## 2023-10-12 ENCOUNTER — Other Ambulatory Visit: Payer: Self-pay

## 2023-10-12 MED ORDER — PREDNISONE 10 MG (21) PO TBPK
ORAL_TABLET | ORAL | 0 refills | Status: AC
  Filled 2023-10-12: qty 21, 6d supply, fill #0

## 2023-10-12 MED ORDER — AMOXICILLIN-POT CLAVULANATE 875-125 MG PO TABS
1.0000 | ORAL_TABLET | Freq: Two times a day (BID) | ORAL | 0 refills | Status: AC
  Filled 2023-10-12: qty 14, 7d supply, fill #0

## 2023-10-12 MED ORDER — HYDROCODONE BIT-HOMATROP MBR 5-1.5 MG/5ML PO SOLN
5.0000 mL | Freq: Four times a day (QID) | ORAL | 0 refills | Status: DC
  Filled 2023-10-12: qty 100, 5d supply, fill #0

## 2023-10-14 ENCOUNTER — Other Ambulatory Visit: Payer: Self-pay

## 2023-10-14 MED ORDER — BENZONATATE 200 MG PO CAPS
200.0000 mg | ORAL_CAPSULE | Freq: Three times a day (TID) | ORAL | 0 refills | Status: DC
  Filled 2023-10-14: qty 30, 10d supply, fill #0

## 2023-11-04 ENCOUNTER — Other Ambulatory Visit: Payer: Self-pay

## 2023-11-04 MED ORDER — HYDROCODONE BIT-HOMATROP MBR 5-1.5 MG/5ML PO SOLN
5.0000 mL | Freq: Four times a day (QID) | ORAL | 0 refills | Status: AC
  Filled 2023-11-04: qty 140, 7d supply, fill #0

## 2023-11-04 MED ORDER — BENZONATATE 200 MG PO CAPS
200.0000 mg | ORAL_CAPSULE | Freq: Three times a day (TID) | ORAL | 0 refills | Status: AC
  Filled 2023-11-04: qty 30, 10d supply, fill #0

## 2023-11-25 ENCOUNTER — Other Ambulatory Visit: Payer: Self-pay | Admitting: Internal Medicine

## 2023-11-25 DIAGNOSIS — Z1231 Encounter for screening mammogram for malignant neoplasm of breast: Secondary | ICD-10-CM

## 2023-12-16 ENCOUNTER — Ambulatory Visit
Admission: RE | Admit: 2023-12-16 | Discharge: 2023-12-16 | Disposition: A | Discharge status: Home / Self Care | Source: Ambulatory Visit | Attending: Internal Medicine | Admitting: Internal Medicine

## 2023-12-16 DIAGNOSIS — Z1231 Encounter for screening mammogram for malignant neoplasm of breast: Secondary | ICD-10-CM
